# Patient Record
Sex: Female | Born: 1950 | Race: White | Hispanic: No | Smoking: Never smoker
Health system: Southern US, Community
[De-identification: ages and names within clinical notes are randomized; demographics above are authoritative.]

## PROBLEM LIST (undated history)

## (undated) DIAGNOSIS — G4730 Sleep apnea, unspecified: Secondary | ICD-10-CM

## (undated) DIAGNOSIS — M65332 Trigger finger, left middle finger: Secondary | ICD-10-CM

## (undated) DIAGNOSIS — M1990 Unspecified osteoarthritis, unspecified site: Secondary | ICD-10-CM

## (undated) DIAGNOSIS — E119 Type 2 diabetes mellitus without complications: Secondary | ICD-10-CM

## (undated) DIAGNOSIS — G5602 Carpal tunnel syndrome, left upper limb: Secondary | ICD-10-CM

## (undated) DIAGNOSIS — F329 Major depressive disorder, single episode, unspecified: Secondary | ICD-10-CM

## (undated) DIAGNOSIS — G5622 Lesion of ulnar nerve, left upper limb: Secondary | ICD-10-CM

## (undated) DIAGNOSIS — F32A Depression, unspecified: Secondary | ICD-10-CM

## (undated) HISTORY — PX: CLAVICLE SURGERY: SHX598

## (undated) HISTORY — PX: BREAST SURGERY: SHX581

## (undated) HISTORY — PX: ABDOMINAL HYSTERECTOMY: SHX81

## (undated) HISTORY — PX: JOINT REPLACEMENT: SHX530

---

## 2014-03-02 DIAGNOSIS — G4733 Obstructive sleep apnea (adult) (pediatric): Secondary | ICD-10-CM

## 2017-07-10 ENCOUNTER — Ambulatory Visit (HOSPITAL_COMMUNITY)
Admission: EM | Admit: 2017-07-10 | Discharge: 2017-07-10 | Disposition: A | Discharge status: Home / Self Care | Payer: Medicare HMO | Attending: Family Medicine | Admitting: Family Medicine

## 2017-07-10 ENCOUNTER — Encounter (HOSPITAL_COMMUNITY): Payer: Self-pay | Admitting: Emergency Medicine

## 2017-07-10 DIAGNOSIS — S80261A Insect bite (nonvenomous), right knee, initial encounter: Secondary | ICD-10-CM

## 2017-07-10 DIAGNOSIS — W57XXXA Bitten or stung by nonvenomous insect and other nonvenomous arthropods, initial encounter: Secondary | ICD-10-CM | POA: Diagnosis not present

## 2017-07-10 HISTORY — DX: Type 2 diabetes mellitus without complications: E11.9

## 2017-07-10 HISTORY — DX: Depression, unspecified: F32.A

## 2017-07-10 HISTORY — DX: Major depressive disorder, single episode, unspecified: F32.9

## 2017-07-10 MED ORDER — DOXYCYCLINE HYCLATE 100 MG PO TABS: 100.0000 mg | ORAL_TABLET | Freq: Two times a day (BID) | ORAL | 0 refills | Status: DC

## 2017-07-10 NOTE — ED Provider Notes (Signed)
[facility]ARE [facility]ENTER   [id number] 07/10/17 Arrival Time: 1819   SUBJECTIVE:  Sabrina Arroyo is a 67 y.o. female who presents to the urgent care with complaint of tick bite to back of right knee.  Patient developed itching behind her right knee at 1 o'clock this morning. She found a tick there and removed it but found to read spots which are very pruritic.  Past Medical History:  Diagnosis Date  . Depression   . Diabetes mellitus without complication (HCC)    History reviewed. No pertinent family history. Social History   Socioeconomic History  . Marital status: Divorced    Spouse name: Not on file  . Number of children: Not on file  . Years of education: Not on file  . Highest education level: Not on file  Occupational History  . Not on file  Social Needs  . Financial resource strain: Not on file  . Food insecurity:    Worry: Not on file    Inability: Not on file  . Transportation needs:    Medical: Not on file    Non-medical: Not on file  Tobacco Use  . Smoking status: Never Smoker  . Smokeless tobacco: Never Used  Substance and Sexual Activity  . Alcohol use: Not Currently  . Drug use: Never  . Sexual activity: Not on file  Lifestyle  . Physical activity:    Days per week: Not on file    Minutes per session: Not on file  . Stress: Not on file  Relationships  . Social connections:    Talks on phone: Not on file    Gets together: Not on file    Attends religious service: Not on file    Active member of club or organization: Not on file    Attends meetings of clubs or organizations: Not on file    Relationship status: Not on file  . Intimate partner violence:    Fear of current or ex partner: Not on file    Emotionally abused: Not on file    Physically abused: Not on file    Forced sexual activity: Not on file  Other Topics Concern  . Not on file  Social History Narrative  . Not on file   No outpatient medications have been marked as taking for  the 07/10/17 encounter [facility] Encounter).   Allergies  Allergen Reactions  . Codeine       ROS: As per HPI, remainder of ROS negative.   OBJECTIVE:   Vitals:   07/10/17 1859  BP: 129/79  Pulse: 84  Resp: 18  Temp: 98.7 F (37.1 C)  TempSrc: Oral  SpO2: 96%     General appearance: alert; no distress Eyes: PERRL; EOMI; conjunctiva normal HENT: normocephalic; atraumatic;  oral mucosa normal Neck: supple Back: no CVA tenderness Extremities: no cyanosis or edema; symmetrical with no gross deformities Skin: warm and dry; 2 red areas behind right knee in the popliteal area which are mildly inflamed but dry.  3 mm tick brought in plastic bag Neurologic: normal gait; grossly normal Psychological: alert and cooperative; normal mood and affect      Labs:  No results found for this or any previous visit.  Labs Reviewed - No data to display  No results found.     ASSESSMENT & PLAN:  1. Tick bite, initial encounter     Meds ordered this encounter  Medications  . doxycycline (VIBRA-TABS) 100 MG tablet    Sig: Take 1 tablet (100 mg  total) by mouth 2 (two) times daily.    Dispense:  20 tablet    Refill:  0    Reviewed expectations re: course of current medical issues. Questions answered. Outlined signs and symptoms indicating need for more acute intervention. Patient verbalized understanding.     Procedures:      Elvina Sidle, MD 07/10/17 4098

## 2017-07-10 NOTE — ED Triage Notes (Signed)
Pt sts tick bite to back of right knee

## 2017-09-24 ENCOUNTER — Ambulatory Visit
Admission: RE | Admit: 2017-09-24 | Discharge: 2017-09-24 | Disposition: A | Discharge status: Home / Self Care | Payer: Medicare HMO | Source: Ambulatory Visit | Attending: Physician Assistant | Admitting: Physician Assistant

## 2017-09-24 ENCOUNTER — Other Ambulatory Visit (INDEPENDENT_AMBULATORY_CARE_PROVIDER_SITE_OTHER): Payer: Self-pay | Admitting: Physician Assistant

## 2017-09-24 DIAGNOSIS — R52 Pain, unspecified: Secondary | ICD-10-CM

## 2018-10-26 ENCOUNTER — Other Ambulatory Visit: Payer: Self-pay | Admitting: Family Medicine

## 2018-10-26 DIAGNOSIS — M545 Low back pain, unspecified: Secondary | ICD-10-CM

## 2018-11-05 ENCOUNTER — Other Ambulatory Visit: Payer: Self-pay

## 2018-11-05 ENCOUNTER — Ambulatory Visit
Admission: RE | Admit: 2018-11-05 | Discharge: 2018-11-05 | Disposition: A | Discharge status: Home / Self Care | Payer: Medicare HMO | Source: Ambulatory Visit | Attending: Family Medicine | Admitting: Family Medicine

## 2018-11-05 DIAGNOSIS — M545 Low back pain, unspecified: Secondary | ICD-10-CM

## 2019-08-01 ENCOUNTER — Ambulatory Visit: Payer: Medicare Other

## 2020-12-10 ENCOUNTER — Other Ambulatory Visit: Payer: Self-pay | Admitting: Orthopedic Surgery

## 2020-12-19 ENCOUNTER — Other Ambulatory Visit
Admission: RE | Admit: 2020-12-19 | Discharge: 2020-12-19 | Disposition: A | Discharge status: Home / Self Care | Payer: Medicare Other | Source: Ambulatory Visit | Attending: Orthopedic Surgery | Admitting: Orthopedic Surgery

## 2020-12-19 ENCOUNTER — Other Ambulatory Visit: Payer: Self-pay

## 2020-12-19 DIAGNOSIS — Z01818 Encounter for other preprocedural examination: Secondary | ICD-10-CM | POA: Diagnosis not present

## 2020-12-19 HISTORY — DX: Sleep apnea, unspecified: G47.30

## 2020-12-19 HISTORY — DX: Unspecified osteoarthritis, unspecified site: M19.90

## 2020-12-19 LAB — CBC WITH DIFFERENTIAL/PLATELET
Abs Immature Granulocytes: 0.03 10*3/uL (ref 0.00–0.07)
Basophils Absolute: 0 10*3/uL (ref 0.0–0.1)
Basophils Relative: 0 %
Eosinophils Absolute: 0.2 10*3/uL (ref 0.0–0.5)
Eosinophils Relative: 2 %
HCT: 39.7 % (ref 36.0–46.0)
Hemoglobin: 13.6 g/dL (ref 12.0–15.0)
Immature Granulocytes: 1 %
Lymphocytes Relative: 24 %
Lymphs Abs: 1.5 10*3/uL (ref 0.7–4.0)
MCH: 28.9 pg (ref 26.0–34.0)
MCHC: 34.3 g/dL (ref 30.0–36.0)
MCV: 84.5 fL (ref 80.0–100.0)
Monocytes Absolute: 0.4 10*3/uL (ref 0.1–1.0)
Monocytes Relative: 6 %
Neutro Abs: 4.3 10*3/uL (ref 1.7–7.7)
Neutrophils Relative %: 67 %
Platelets: 195 10*3/uL (ref 150–400)
RBC: 4.7 MIL/uL (ref 3.87–5.11)
RDW: 13.2 % (ref 11.5–15.5)
WBC: 6.4 10*3/uL (ref 4.0–10.5)
nRBC: 0 % (ref 0.0–0.2)

## 2020-12-19 LAB — TYPE AND SCREEN
ABO/RH(D): A POS
Antibody Screen: NEGATIVE

## 2020-12-19 LAB — URINALYSIS, ROUTINE W REFLEX MICROSCOPIC
Bilirubin Urine: NEGATIVE
Glucose, UA: NEGATIVE mg/dL
Hgb urine dipstick: NEGATIVE
Ketones, ur: NEGATIVE mg/dL
Leukocytes,Ua: NEGATIVE
Nitrite: NEGATIVE
Protein, ur: NEGATIVE mg/dL
Specific Gravity, Urine: 1.012 (ref 1.005–1.030)
pH: 5 (ref 5.0–8.0)

## 2020-12-19 LAB — COMPREHENSIVE METABOLIC PANEL
ALT: 33 U/L (ref 0–44)
AST: 29 U/L (ref 15–41)
Albumin: 4 g/dL (ref 3.5–5.0)
Alkaline Phosphatase: 60 U/L (ref 38–126)
Anion gap: 7 (ref 5–15)
BUN: 22 mg/dL (ref 8–23)
CO2: 29 mmol/L (ref 22–32)
Calcium: 9.7 mg/dL (ref 8.9–10.3)
Chloride: 100 mmol/L (ref 98–111)
Creatinine, Ser: 0.81 mg/dL (ref 0.44–1.00)
GFR, Estimated: >60 mL/min (ref >60)
Glucose, Bld: 146 mg/dL — ABNORMAL HIGH (ref 70–99)
Potassium: 4.2 mmol/L (ref 3.5–5.1)
Sodium: 136 mmol/L (ref 135–145)
Total Bilirubin: 0.9 mg/dL (ref 0.3–1.2)
Total Protein: 7.6 g/dL (ref 6.5–8.1)

## 2020-12-19 LAB — SURGICAL PCR SCREEN
MRSA, PCR: NEGATIVE
Staphylococcus aureus: NEGATIVE

## 2020-12-19 NOTE — Patient Instructions (Addendum)
Your procedure is scheduled on: Tuesday 12/31/20 Report to the Registration Desk on the 1st floor of the [facility]. To find out your arrival time, please call [phone] between 1PM - 3PM on: Monday 12/30/20  REMEMBER: Instructions that are not followed completely may result in serious medical risk, up to and including death; or upon the discretion of your surgeon and anesthesiologist your surgery may need to be rescheduled.  Do not eat food after midnight the night before surgery.  No gum chewing, lozengers or hard candies.  You may however, drink CLEAR water up to 2 hours before you are scheduled to arrive for your surgery. Do not drink anything within 2 hours of your scheduled arrival time.  Type 1 and Type 2 diabetics should only drink water.  TAKE THESE MEDICATIONS THE MORNING OF SURGERY WITH A SIP OF WATER: omeprazole (PRILOSEC) 20 MG capsule (take one the night before and one on the morning of surgery - helps to prevent nausea after surgery.)  You will need to take your Metformin Saturday 12/28/20 and no more until after surgery.   Follow recommendations from Dr. Rosita Kea or PCP regarding stopping Aspirin  One week prior to surgery: Stop Anti-inflammatories (NSAIDS) such as Advil, Aleve, Ibuprofen, Motrin, Naproxen, Naprosyn and Aspirin based products such as Excedrin, Goodys Powder, BC Powder. Stop ANY OVER THE COUNTER supplements until after surgery such as Biotin 17510 MCG TABS, Cholecalciferol 25 MCG (1000 UT) capsule, Multiple Vitamins-Minerals (MULTIVITAMIN WITH MINERALS) tablet, vitamin C (ASCORBIC ACID) 500 MG tablet.  You may however, continue to take Tylenol if needed for pain up until the day of surgery.  No Alcohol for 24 hours before or after surgery.  No Smoking including e-cigarettes for 24 hours prior to surgery.  No chewable tobacco products for at least 6 hours prior to surgery.  No nicotine patches on the day of surgery.  Do not use any "recreational"  drugs for at least a week prior to your surgery.  Please be advised that the combination of cocaine and anesthesia may have negative outcomes, up to and including death. If you test positive for cocaine, your surgery will be cancelled.  On the morning of surgery brush your teeth with toothpaste and water, you may rinse your mouth with mouthwash if you wish. Do not swallow any toothpaste or mouthwash.  Use CHG Soap or wipes as directed on instruction sheet.  Do not wear jewelry, make-up, hairpins, clips or nail polish.  Do not wear lotions, powders, or perfumes.   Do not shave body from the neck down 48 hours prior to surgery just in case you cut yourself which could leave a site for infection.  Also, freshly shaved skin may become irritated if using the CHG soap.  Hearing aids and may not be worn into surgery.  Do not bring valuables to the hospital. [facility] is not responsible for any missing/lost belongings or valuables.   Notify your doctor if there is any change in your medical condition (cold, fever, infection).  Wear comfortable clothing (specific to your surgery type) to the hospital.  After surgery, you can help prevent lung complications by doing breathing exercises.  Take deep breaths and cough every 1-2 hours. Your doctor may order a device called an Incentive Spirometer to help you take deep breaths.  If you are being admitted to the hospital overnight, leave your suitcase in the car. After surgery it may be brought to your room.  If you are taking public transportation, you  will need to have a responsible adult (18 years or older) with you. Please confirm with your physician that it is acceptable to use public transportation.   Please call the Pre-admissions Testing Dept. at [phone] if you have any questions about these instructions.  Surgery Visitation Policy:  Patients undergoing a surgery or procedure may have one family member or support person with  them as long as that person is not COVID-19 positive or experiencing its symptoms.  That person may remain in the waiting area during the procedure and may rotate out with other people.  Inpatient Visitation:    Visiting hours are 7 a.m. to 8 p.m. Up to two visitors ages 16+ are allowed at one time in a patient room. The visitors may rotate out with other people during the day. Visitors must check out when they leave, or other visitors will not be allowed. One designated support person may remain overnight. The visitor must pass COVID-19 screenings, use hand sanitizer when entering and exiting the patient's room and wear a mask at all times, including in the patient's room. Patients must also wear a mask when staff or their visitor are in the room. Masking is required regardless of vaccination status.

## 2020-12-27 ENCOUNTER — Other Ambulatory Visit: Admission: RE | Admit: 2020-12-27 | Payer: Medicare Other | Source: Ambulatory Visit

## 2020-12-31 ENCOUNTER — Observation Stay: Payer: Medicare Other

## 2020-12-31 ENCOUNTER — Other Ambulatory Visit: Payer: Self-pay

## 2020-12-31 ENCOUNTER — Ambulatory Visit: Payer: Medicare Other | Admitting: Certified Registered Nurse Anesthetist

## 2020-12-31 ENCOUNTER — Encounter
Admission: RE | Disposition: A | Discharge status: Home / Self Care | Payer: Self-pay | Source: Ambulatory Visit | Attending: Orthopedic Surgery

## 2020-12-31 ENCOUNTER — Observation Stay
Admission: RE | Admit: 2020-12-31 | Discharge: 2021-01-02 | Disposition: A | Discharge status: Home / Self Care | Payer: Medicare Other | Source: Ambulatory Visit | Attending: Orthopedic Surgery | Admitting: Orthopedic Surgery

## 2020-12-31 ENCOUNTER — Encounter: Payer: Self-pay | Admitting: Orthopedic Surgery

## 2020-12-31 DIAGNOSIS — Z96651 Presence of right artificial knee joint: Secondary | ICD-10-CM | POA: Diagnosis not present

## 2020-12-31 DIAGNOSIS — Z79899 Other long term (current) drug therapy: Secondary | ICD-10-CM | POA: Diagnosis not present

## 2020-12-31 DIAGNOSIS — Z7984 Long term (current) use of oral hypoglycemic drugs: Secondary | ICD-10-CM | POA: Insufficient documentation

## 2020-12-31 DIAGNOSIS — I10 Essential (primary) hypertension: Secondary | ICD-10-CM | POA: Diagnosis not present

## 2020-12-31 DIAGNOSIS — Z96652 Presence of left artificial knee joint: Secondary | ICD-10-CM

## 2020-12-31 DIAGNOSIS — Z8616 Personal history of COVID-19: Secondary | ICD-10-CM | POA: Diagnosis not present

## 2020-12-31 DIAGNOSIS — M1712 Unilateral primary osteoarthritis, left knee: Secondary | ICD-10-CM | POA: Diagnosis present

## 2020-12-31 DIAGNOSIS — G8918 Other acute postprocedural pain: Secondary | ICD-10-CM

## 2020-12-31 HISTORY — PX: TOTAL KNEE ARTHROPLASTY: SHX125

## 2020-12-31 LAB — ABO/RH: ABO/RH(D): A POS

## 2020-12-31 LAB — CBC
HCT: 38.1 % (ref 36.0–46.0)
Hemoglobin: 12.3 g/dL (ref 12.0–15.0)
MCH: 27.6 pg (ref 26.0–34.0)
MCHC: 32.3 g/dL (ref 30.0–36.0)
MCV: 85.6 fL (ref 80.0–100.0)
Platelets: 168 10*3/uL (ref 150–400)
RBC: 4.45 MIL/uL (ref 3.87–5.11)
RDW: 13.5 % (ref 11.5–15.5)
WBC: 9.2 10*3/uL (ref 4.0–10.5)
nRBC: 0 % (ref 0.0–0.2)

## 2020-12-31 LAB — CREATININE, SERUM
Creatinine, Ser: 0.93 mg/dL (ref 0.44–1.00)
GFR, Estimated: >60 mL/min (ref >60)

## 2020-12-31 LAB — GLUCOSE, CAPILLARY
Glucose-Capillary: 149 mg/dL — ABNORMAL HIGH (ref 70–99)
Glucose-Capillary: 160 mg/dL — ABNORMAL HIGH (ref 70–99)
Glucose-Capillary: 165 mg/dL — ABNORMAL HIGH (ref 70–99)
Glucose-Capillary: 127 mg/dL — ABNORMAL HIGH (ref 70–99)

## 2020-12-31 SURGERY — ARTHROPLASTY, KNEE, TOTAL
Anesthesia: Spinal | Site: Knee | Laterality: Left

## 2020-12-31 MED ORDER — BISACODYL 10 MG RE SUPP
10.0000 mg | Freq: Every day | RECTAL | Status: DC | PRN
  Filled 2020-12-31: qty 1

## 2020-12-31 MED ORDER — HYDROCODONE-ACETAMINOPHEN 5-325 MG PO TABS
1.0000 | ORAL_TABLET | ORAL | Status: DC | PRN
  Administered 2021-01-01: 1 via ORAL
  Filled 2020-12-31: qty 1

## 2020-12-31 MED ORDER — DOCUSATE SODIUM 100 MG PO CAPS
100.0000 mg | ORAL_CAPSULE | Freq: Two times a day (BID) | ORAL | Status: DC
  Administered 2020-12-31 – 2021-01-02 (×4): 100 mg via ORAL
  Filled 2020-12-31 (×4): qty 1

## 2020-12-31 MED ORDER — ALUM & MAG HYDROXIDE-SIMETH 200-200-20 MG/5ML PO SUSP: 30.0000 mL | ORAL | Status: DC | PRN

## 2020-12-31 MED ORDER — OXYCODONE HCL 5 MG PO TABS
5.0000 mg | ORAL_TABLET | Freq: Once | ORAL | Status: DC | PRN
Start: 2020-12-31 — End: 2020-12-31

## 2020-12-31 MED ORDER — PROPOFOL 500 MG/50ML IV EMUL
INTRAVENOUS | Status: DC | PRN
  Administered 2020-12-31: 100 ug/kg/min via INTRAVENOUS

## 2020-12-31 MED ORDER — GLYCOPYRROLATE 0.2 MG/ML IJ SOLN
INTRAMUSCULAR | Status: DC | PRN
  Administered 2020-12-31: .1 mg via INTRAVENOUS
  Administered 2020-12-31: .2 mg via INTRAVENOUS

## 2020-12-31 MED ORDER — CHLORHEXIDINE GLUCONATE 0.12 % MT SOLN: 15.0000 mL | Freq: Once | OROMUCOSAL | Status: AC

## 2020-12-31 MED ORDER — MORPHINE SULFATE (PF) 2 MG/ML IV SOLN
0.5000 mg | INTRAVENOUS | Status: DC | PRN
  Administered 2020-12-31: 1 mg via INTRAVENOUS
  Filled 2020-12-31: qty 1

## 2020-12-31 MED ORDER — ORAL CARE MOUTH RINSE: 15.0000 mL | Freq: Once | OROMUCOSAL | Status: AC

## 2020-12-31 MED ORDER — BUPIVACAINE HCL (PF) 0.5 % IJ SOLN
INTRAMUSCULAR | Status: AC
  Filled 2020-12-31: qty 10

## 2020-12-31 MED ORDER — BUPIVACAINE-EPINEPHRINE (PF) 0.25% -1:200000 IJ SOLN
INTRAMUSCULAR | Status: AC
  Filled 2020-12-31: qty 30

## 2020-12-31 MED ORDER — ENOXAPARIN SODIUM 30 MG/0.3ML IJ SOSY
30.0000 mg | PREFILLED_SYRINGE | Freq: Two times a day (BID) | INTRAMUSCULAR | Status: DC
  Administered 2021-01-01 – 2021-01-02 (×3): 30 mg via SUBCUTANEOUS
  Filled 2020-12-31 (×3): qty 0.3

## 2020-12-31 MED ORDER — SODIUM CHLORIDE 0.9 % IV SOLN: INTRAVENOUS | Status: DC

## 2020-12-31 MED ORDER — CEFAZOLIN SODIUM-DEXTROSE 2-4 GM/100ML-% IV SOLN
INTRAVENOUS | Status: AC
  Filled 2020-12-31: qty 100

## 2020-12-31 MED ORDER — NEOMYCIN-POLYMYXIN B GU 40-200000 IR SOLN
Status: AC
  Filled 2020-12-31: qty 1

## 2020-12-31 MED ORDER — CEFAZOLIN SODIUM-DEXTROSE 2-4 GM/100ML-% IV SOLN
2.0000 g | Freq: Four times a day (QID) | INTRAVENOUS | Status: AC
  Administered 2020-12-31 (×2): 2 g via INTRAVENOUS
  Filled 2020-12-31 (×2): qty 100

## 2020-12-31 MED ORDER — PRAMIPEXOLE DIHYDROCHLORIDE 0.25 MG PO TABS
0.5000 mg | ORAL_TABLET | Freq: Every day | ORAL | Status: DC
  Administered 2020-12-31 – 2021-01-01 (×2): 0.5 mg via ORAL
  Filled 2020-12-31 (×3): qty 2

## 2020-12-31 MED ORDER — TRANEXAMIC ACID 1000 MG/10ML IV SOLN
INTRAVENOUS | Status: AC
  Filled 2020-12-31: qty 10

## 2020-12-31 MED ORDER — HYDROCODONE-ACETAMINOPHEN 7.5-325 MG PO TABS
ORAL_TABLET | ORAL | Status: AC
  Administered 2020-12-31: 1 via ORAL
  Filled 2020-12-31: qty 1

## 2020-12-31 MED ORDER — FLEET ENEMA 7-19 GM/118ML RE ENEM: 1.0000 | ENEMA | Freq: Once | RECTAL | Status: DC | PRN

## 2020-12-31 MED ORDER — ADULT MULTIVITAMIN W/MINERALS CH
1.0000 | ORAL_TABLET | Freq: Every day | ORAL | Status: DC
  Administered 2021-01-01 – 2021-01-02 (×2): 1 via ORAL
  Filled 2020-12-31 (×2): qty 1

## 2020-12-31 MED ORDER — ACETAMINOPHEN 10 MG/ML IV SOLN: 1000.0000 mg | Freq: Once | INTRAVENOUS | Status: DC | PRN

## 2020-12-31 MED ORDER — PHENYLEPHRINE HCL (PRESSORS) 10 MG/ML IV SOLN
INTRAVENOUS | Status: AC
  Filled 2020-12-31: qty 1

## 2020-12-31 MED ORDER — DIPHENHYDRAMINE HCL 12.5 MG/5ML PO ELIX
12.5000 mg | ORAL_SOLUTION | ORAL | Status: DC | PRN
  Filled 2020-12-31: qty 10

## 2020-12-31 MED ORDER — SODIUM CHLORIDE (PF) 0.9 % IJ SOLN
INTRAMUSCULAR | Status: DC | PRN
  Administered 2020-12-31: 91 mL

## 2020-12-31 MED ORDER — EPHEDRINE 5 MG/ML INJ
INTRAVENOUS | Status: AC
  Filled 2020-12-31: qty 5

## 2020-12-31 MED ORDER — PHENYLEPHRINE HCL-NACL 20-0.9 MG/250ML-% IV SOLN
INTRAVENOUS | Status: DC | PRN
  Administered 2020-12-31: 30 ug/min via INTRAVENOUS

## 2020-12-31 MED ORDER — OXYCODONE HCL 5 MG/5ML PO SOLN: 5.0000 mg | Freq: Once | ORAL | Status: DC | PRN

## 2020-12-31 MED ORDER — ONDANSETRON HCL 4 MG/2ML IJ SOLN: 4.0000 mg | Freq: Four times a day (QID) | INTRAMUSCULAR | Status: DC | PRN

## 2020-12-31 MED ORDER — ONDANSETRON HCL 4 MG PO TABS
4.0000 mg | ORAL_TABLET | Freq: Four times a day (QID) | ORAL | Status: DC | PRN
  Administered 2020-12-31: 4 mg via ORAL
  Filled 2020-12-31: qty 1

## 2020-12-31 MED ORDER — POLYETHYLENE GLYCOL 3350 17 G PO PACK
17.0000 g | PACK | Freq: Every day | ORAL | Status: DC | PRN
  Filled 2020-12-31: qty 1

## 2020-12-31 MED ORDER — HYDROCODONE-ACETAMINOPHEN 7.5-325 MG PO TABS
1.0000 | ORAL_TABLET | ORAL | Status: DC | PRN
  Administered 2020-12-31: 1 via ORAL
  Filled 2020-12-31: qty 1

## 2020-12-31 MED ORDER — PHENOL 1.4 % MT LIQD
1.0000 | OROMUCOSAL | Status: DC | PRN
  Filled 2020-12-31: qty 177

## 2020-12-31 MED ORDER — MIDAZOLAM HCL 5 MG/5ML IJ SOLN
INTRAMUSCULAR | Status: DC | PRN
  Administered 2020-12-31: 2 mg via INTRAVENOUS

## 2020-12-31 MED ORDER — ONDANSETRON HCL 4 MG/2ML IJ SOLN
INTRAMUSCULAR | Status: AC
  Filled 2020-12-31: qty 2

## 2020-12-31 MED ORDER — METOCLOPRAMIDE HCL 10 MG PO TABS: 5.0000 mg | ORAL_TABLET | Freq: Three times a day (TID) | ORAL | Status: DC | PRN

## 2020-12-31 MED ORDER — TRAMADOL HCL 50 MG PO TABS
50.0000 mg | ORAL_TABLET | Freq: Four times a day (QID) | ORAL | Status: DC
  Administered 2020-12-31 – 2021-01-02 (×8): 50 mg via ORAL
  Filled 2020-12-31 (×9): qty 1

## 2020-12-31 MED ORDER — MENTHOL 3 MG MT LOZG
1.0000 | LOZENGE | OROMUCOSAL | Status: DC | PRN
  Filled 2020-12-31: qty 9

## 2020-12-31 MED ORDER — METFORMIN HCL ER 500 MG PO TB24
1000.0000 mg | ORAL_TABLET | Freq: Two times a day (BID) | ORAL | Status: DC
  Administered 2020-12-31 – 2021-01-02 (×4): 1000 mg via ORAL
  Filled 2020-12-31 (×7): qty 2

## 2020-12-31 MED ORDER — CEFAZOLIN SODIUM-DEXTROSE 2-4 GM/100ML-% IV SOLN
2.0000 g | INTRAVENOUS | Status: AC
  Administered 2020-12-31: 2 g via INTRAVENOUS

## 2020-12-31 MED ORDER — ZOLPIDEM TARTRATE 5 MG PO TABS: 5.0000 mg | ORAL_TABLET | Freq: Every evening | ORAL | Status: DC | PRN

## 2020-12-31 MED ORDER — PROMETHAZINE HCL 25 MG/ML IJ SOLN: 6.2500 mg | INTRAMUSCULAR | Status: DC | PRN

## 2020-12-31 MED ORDER — MORPHINE SULFATE (PF) 10 MG/ML IV SOLN
INTRAVENOUS | Status: AC
  Filled 2020-12-31: qty 1

## 2020-12-31 MED ORDER — CHLORHEXIDINE GLUCONATE 0.12 % MT SOLN
OROMUCOSAL | Status: AC
  Administered 2020-12-31: 15 mL via OROMUCOSAL
  Filled 2020-12-31: qty 15

## 2020-12-31 MED ORDER — AMPHETAMINE-DEXTROAMPHET ER 5 MG PO CP24
20.0000 mg | ORAL_CAPSULE | Freq: Every day | ORAL | Status: DC
  Administered 2021-01-01 – 2021-01-02 (×2): 20 mg via ORAL
  Filled 2020-12-31 (×3): qty 4
  Filled 2020-12-31: qty 1

## 2020-12-31 MED ORDER — BUPIVACAINE HCL (PF) 0.5 % IJ SOLN
INTRAMUSCULAR | Status: DC | PRN
  Administered 2020-12-31: 2.5 mL

## 2020-12-31 MED ORDER — ACETAMINOPHEN 325 MG PO TABS
325.0000 mg | ORAL_TABLET | Freq: Four times a day (QID) | ORAL | Status: DC | PRN
  Administered 2021-01-01: 650 mg via ORAL
  Filled 2020-12-31: qty 2

## 2020-12-31 MED ORDER — GLYCOPYRROLATE 0.2 MG/ML IJ SOLN
INTRAMUSCULAR | Status: AC
  Filled 2020-12-31: qty 2

## 2020-12-31 MED ORDER — FENTANYL CITRATE (PF) 100 MCG/2ML IJ SOLN
INTRAMUSCULAR | Status: AC
  Filled 2020-12-31: qty 2

## 2020-12-31 MED ORDER — SODIUM CHLORIDE FLUSH 0.9 % IV SOLN
INTRAVENOUS | Status: AC
  Filled 2020-12-31: qty 40

## 2020-12-31 MED ORDER — PROPOFOL 1000 MG/100ML IV EMUL
INTRAVENOUS | Status: AC
  Filled 2020-12-31: qty 100

## 2020-12-31 MED ORDER — MIDAZOLAM HCL 2 MG/2ML IJ SOLN
INTRAMUSCULAR | Status: AC
  Filled 2020-12-31: qty 2

## 2020-12-31 MED ORDER — ONDANSETRON HCL 4 MG/2ML IJ SOLN
INTRAMUSCULAR | Status: DC | PRN
  Administered 2020-12-31: 4 mg via INTRAVENOUS

## 2020-12-31 MED ORDER — ARIPIPRAZOLE 5 MG PO TABS
5.0000 mg | ORAL_TABLET | Freq: Every day | ORAL | Status: DC
  Administered 2020-12-31 – 2021-01-01 (×2): 5 mg via ORAL
  Filled 2020-12-31 (×3): qty 1

## 2020-12-31 MED ORDER — ASPIRIN EC 81 MG PO TBEC
81.0000 mg | DELAYED_RELEASE_TABLET | Freq: Every day | ORAL | Status: DC
  Administered 2021-01-01 – 2021-01-02 (×2): 81 mg via ORAL
  Filled 2020-12-31 (×2): qty 1

## 2020-12-31 MED ORDER — VORTIOXETINE HBR 5 MG PO TABS
20.0000 mg | ORAL_TABLET | Freq: Every day | ORAL | Status: DC
  Administered 2020-12-31 – 2021-01-01 (×2): 20 mg via ORAL
  Filled 2020-12-31 (×3): qty 4

## 2020-12-31 MED ORDER — SODIUM CHLORIDE 0.9 % IR SOLN: Status: DC | PRN

## 2020-12-31 MED ORDER — PANTOPRAZOLE SODIUM 40 MG PO TBEC
40.0000 mg | DELAYED_RELEASE_TABLET | Freq: Every day | ORAL | Status: DC
  Administered 2021-01-01 – 2021-01-02 (×2): 40 mg via ORAL
  Filled 2020-12-31 (×2): qty 1

## 2020-12-31 MED ORDER — STERILE WATER FOR IRRIGATION IR SOLN
Status: DC | PRN
  Administered 2020-12-31: 1000 mL

## 2020-12-31 MED ORDER — METOCLOPRAMIDE HCL 5 MG/ML IJ SOLN: 5.0000 mg | Freq: Three times a day (TID) | INTRAMUSCULAR | Status: DC | PRN

## 2020-12-31 MED ORDER — METHOCARBAMOL 500 MG PO TABS
500.0000 mg | ORAL_TABLET | Freq: Four times a day (QID) | ORAL | Status: DC | PRN
  Administered 2020-12-31 – 2021-01-02 (×2): 500 mg via ORAL
  Filled 2020-12-31 (×2): qty 1

## 2020-12-31 MED ORDER — BUPIVACAINE LIPOSOME 1.3 % IJ SUSP
INTRAMUSCULAR | Status: AC
  Filled 2020-12-31: qty 20

## 2020-12-31 MED ORDER — ROSUVASTATIN CALCIUM 20 MG PO TABS
40.0000 mg | ORAL_TABLET | Freq: Every day | ORAL | Status: DC
  Administered 2020-12-31 – 2021-01-01 (×2): 40 mg via ORAL
  Filled 2020-12-31: qty 4
  Filled 2020-12-31 (×2): qty 2
  Filled 2020-12-31: qty 4
  Filled 2020-12-31: qty 2

## 2020-12-31 MED ORDER — TRANEXAMIC ACID-NACL 1000-0.7 MG/100ML-% IV SOLN
INTRAVENOUS | Status: DC | PRN
  Administered 2020-12-31: 1000 mg via INTRAVENOUS

## 2020-12-31 MED ORDER — INSULIN ASPART 100 UNIT/ML IJ SOLN
0.0000 [IU] | Freq: Three times a day (TID) | INTRAMUSCULAR | Status: DC
  Administered 2021-01-02 (×2): 3 [IU] via SUBCUTANEOUS
  Filled 2020-12-31 (×5): qty 1

## 2020-12-31 MED ORDER — METHOCARBAMOL 1000 MG/10ML IJ SOLN
500.0000 mg | Freq: Four times a day (QID) | INTRAVENOUS | Status: DC | PRN
  Filled 2020-12-31: qty 5

## 2020-12-31 MED ORDER — SODIUM CHLORIDE 0.9 % IV SOLN
INTRAVENOUS | Status: DC
  Administered 2020-12-31: 1000 mL via INTRAVENOUS

## 2020-12-31 SURGICAL SUPPLY — 67 items
BLADE SAGITTAL 25.0X1.19X90 (BLADE) ×2 IMPLANT
BLADE SAW 90X13X1.19 OSCILLAT (BLADE) ×2 IMPLANT
BNDG ELASTIC 6X5.8 VLCR STR LF (GAUZE/BANDAGES/DRESSINGS) ×2 IMPLANT
CANISTER WOUND CARE 500ML ATS (WOUND CARE) ×2 IMPLANT
CEMENT FEMORAL COMP SZ3P LEFT (Femur) ×2 IMPLANT
CEMENT HV SMART SET (Cement) ×4 IMPLANT
CHLORAPREP W/TINT 26 (MISCELLANEOUS) ×4 IMPLANT
COOLER POLAR GLACIER W/PUMP (MISCELLANEOUS) ×2 IMPLANT
CUFF TOURN SGL QUICK 24 (TOURNIQUET CUFF)
CUFF TOURN SGL QUICK 34 (TOURNIQUET CUFF)
CUFF TRNQT CYL 24X4X16.5-23 (TOURNIQUET CUFF) IMPLANT
CUFF TRNQT CYL 34X4.125X (TOURNIQUET CUFF) IMPLANT
DRAPE 3/4 80X56 (DRAPES) ×4 IMPLANT
DRSG MEPILEX SACRM 8.7X9.8 (GAUZE/BANDAGES/DRESSINGS) ×2 IMPLANT
ELECT CAUTERY BLADE 6.4 (BLADE) ×2 IMPLANT
ELECT REM PT RETURN 9FT ADLT (ELECTROSURGICAL) ×2
ELECTRODE REM PT RTRN 9FT ADLT (ELECTROSURGICAL) ×1 IMPLANT
GAUZE 4X4 16PLY ~~LOC~~+RFID DBL (SPONGE) ×2 IMPLANT
GAUZE SPONGE 4X4 12PLY STRL (GAUZE/BANDAGES/DRESSINGS) ×2 IMPLANT
GAUZE XEROFORM 1X8 LF (GAUZE/BANDAGES/DRESSINGS) ×2 IMPLANT
GLOVE SURG ORTHO LTX SZ8 (GLOVE) ×2 IMPLANT
GLOVE SURG SYN 9.0  PF PI (GLOVE) ×1
GLOVE SURG SYN 9.0 PF PI (GLOVE) ×1 IMPLANT
GLOVE SURG UNDER LTX SZ8 (GLOVE) ×2 IMPLANT
GLOVE SURG UNDER POLY LF SZ9 (GLOVE) ×2 IMPLANT
GOWN SRG 2XL LVL 4 RGLN SLV (GOWNS) ×1 IMPLANT
GOWN STRL NON-REIN 2XL LVL4 (GOWNS) ×1
GOWN STRL REUS W/ TWL LRG LVL3 (GOWN DISPOSABLE) ×1 IMPLANT
GOWN STRL REUS W/ TWL XL LVL3 (GOWN DISPOSABLE) ×1 IMPLANT
GOWN STRL REUS W/TWL LRG LVL3 (GOWN DISPOSABLE) ×1
GOWN STRL REUS W/TWL XL LVL3 (GOWN DISPOSABLE) ×1
HOLDER FOLEY CATH W/STRAP (MISCELLANEOUS) ×2 IMPLANT
INSERT TIBIAL SZ3 L HT 10M FLX (Insert) ×2 IMPLANT
IRRIGATION SURGIPHOR STRL (IV SOLUTION) IMPLANT
IV NS IRRIG 3000ML ARTHROMATIC (IV SOLUTION) ×2 IMPLANT
KIT PREVENA INCISION MGT20CM45 (CANNISTER) ×2 IMPLANT
KIT TURNOVER KIT A (KITS) ×2 IMPLANT
MANIFOLD NEPTUNE II (INSTRUMENTS) ×2 IMPLANT
NDL SAFETY ECLIPSE 18X1.5 (NEEDLE) ×1 IMPLANT
NEEDLE HYPO 18GX1.5 SHARP (NEEDLE) ×1
NEEDLE SPNL 18GX3.5 QUINCKE PK (NEEDLE) ×2 IMPLANT
NEEDLE SPNL 20GX3.5 QUINCKE YW (NEEDLE) ×2 IMPLANT
NS IRRIG 1000ML POUR BTL (IV SOLUTION) ×2 IMPLANT
PACK TOTAL KNEE (MISCELLANEOUS) ×2 IMPLANT
PAD WRAPON POLAR KNEE (MISCELLANEOUS) ×1 IMPLANT
PATELLA RESURFACING MEDACTA 02 (Bone Implant) ×2 IMPLANT
PENCIL SMOKE EVACUATOR COATED (MISCELLANEOUS) ×2 IMPLANT
PULSAVAC PLUS IRRIG FAN TIP (DISPOSABLE) ×2
SCALPEL PROTECTED #10 DISP (BLADE) ×4 IMPLANT
SPONGE T-LAP 18X18 ~~LOC~~+RFID (SPONGE) ×6 IMPLANT
STAPLER SKIN PROX 35W (STAPLE) ×2 IMPLANT
STEM EXTENSION 11MMX30MM (Stem) ×2 IMPLANT
SUCTION FRAZIER HANDLE 10FR (MISCELLANEOUS) ×1
SUCTION TUBE FRAZIER 10FR DISP (MISCELLANEOUS) ×1 IMPLANT
SUT DVC 2 QUILL PDO  T11 36X36 (SUTURE) ×1
SUT DVC 2 QUILL PDO T11 36X36 (SUTURE) ×1 IMPLANT
SUT ETHIBOND 2 V 37 (SUTURE) ×2 IMPLANT
SUT V-LOC 90 ABS DVC 3-0 CL (SUTURE) ×2 IMPLANT
SYR 20ML LL LF (SYRINGE) ×2 IMPLANT
SYR 50ML LL SCALE MARK (SYRINGE) ×4 IMPLANT
TIBIAL TRAY FIXED MEDACTA 0207 (Bone Implant) ×2 IMPLANT
TIP FAN IRRIG PULSAVAC PLUS (DISPOSABLE) ×1 IMPLANT
TOWEL OR 17X26 4PK STRL BLUE (TOWEL DISPOSABLE) ×2 IMPLANT
TOWER CARTRIDGE SMART MIX (DISPOSABLE) ×2 IMPLANT
TRAY FOLEY MTR SLVR 16FR STAT (SET/KITS/TRAYS/PACK) ×2 IMPLANT
WATER STERILE IRR 500ML POUR (IV SOLUTION) ×2 IMPLANT
WRAPON POLAR PAD KNEE (MISCELLANEOUS) ×2

## 2020-12-31 NOTE — Transfer of Care (Signed)
Immediate Anesthesia Transfer of Care Note  Patient: Sabrina Arroyo  Procedure(s) Performed: TOTAL KNEE ARTHROPLASTY (Left: Knee)  Patient Location: PACU  Anesthesia Type:Spinal  Level of Consciousness: awake  Airway & Oxygen Therapy: Patient Spontanous Breathing and Patient connected to face mask oxygen  Post-op Assessment: Report given to RN and Post -op Vital signs reviewed and stable  Post vital signs: Reviewed  Last Vitals:  Vitals Value Taken Time  BP 102/61 12/31/20 0945  Temp    Pulse    Resp 12 12/31/20 0946  SpO2    Vitals shown include unvalidated device data.  Last Pain:  Vitals:   12/31/20 0629  TempSrc: Oral  PainSc: 0-No pain         Complications: No notable events documented.

## 2020-12-31 NOTE — Progress Notes (Signed)
OT Cancellation Note  Patient Details Name: Sabrina Arroyo MRN: [id number] DOB: [birth year]   Cancelled Treatment:    Reason Eval/Treat Not Completed: Pain limiting ability to participate (pt in significant pain affecting meaningful participation in further mobility, education re: ADL performance and participation in evaluation, will re-attempt at a later time.)  Oleta Mouse, OTD OTR/L  12/31/20, 3:20 PM

## 2020-12-31 NOTE — Evaluation (Signed)
Physical Therapy Evaluation Patient Details Name: Sabrina Arroyo MRN: [id number] DOB: [birth year] Today's Date: 12/31/2020  History of Present Illness  The patient presents for L TKA.  She had a prior right total knee arthroplasty in 2014.  Clinical Impression  Patient received in bed, reporting pain. Grimacing. She is agreeable to PT assessment. Required min assist for bed mobility and cues for scooting to edge of bed. Min assist for sit to stand at edge of bed. Patient was able to weight shift in standing, but declined ambulation in room at this time. She will continue to benefit from skilled PT while here to improve strength, rom and functional independence for safe return home.        Recommendations for follow up therapy are one component of a multi-disciplinary discharge planning process, led by the attending physician.  Recommendations may be updated based on patient status, additional functional criteria and insurance authorization.  Follow Up Recommendations Home health PT    Assistance Recommended at Discharge Intermittent Supervision/Assistance  Functional Status Assessment Patient has had a recent decline in their functional status and demonstrates the ability to make significant improvements in function in a reasonable and predictable amount of time.  Equipment Recommendations  3in1 (PT);Rolling walker (2 wheels)    Recommendations for Other Services       Precautions / Restrictions Precautions Precautions: Fall Restrictions Weight Bearing Restrictions: Yes LLE Weight Bearing: Weight bearing as tolerated      Mobility  Bed Mobility Overal bed mobility: Needs Assistance Bed Mobility: Supine to Sit;Sit to Supine     Supine to sit: Min assist Sit to supine: Min assist   General bed mobility comments: min assist to move L LE in bed and to raise trunk to seated position.    Transfers Overall transfer level: Needs assistance Equipment used: Rolling walker (2  wheels) Transfers: Sit to/from Stand Sit to Stand: Min guard                Ambulation/Gait             General Gait Details: patient declining ambulation at this time due to pain.  Stairs            Wheelchair Mobility    Modified Rankin (Stroke Patients Only)       Balance Overall balance assessment: Needs assistance Sitting-balance support: Feet supported Sitting balance-Leahy Scale: Good     Standing balance support: Bilateral upper extremity supported;During functional activity Standing balance-Leahy Scale: Fair Standing balance comment: Requiring B UE support and min guard                             Pertinent Vitals/Pain Pain Assessment: 0-10 Pain Score: 4  Pain Location: L knee Pain Descriptors / Indicators: Discomfort;Aching;Grimacing;Guarding;Sore Pain Intervention(s): Monitored during session;Limited activity within patient's tolerance;Repositioned;Premedicated before session;Ice applied    Home Living Family/patient expects to be discharged to:: Private residence Living Arrangements: Alone Available Help at Discharge: Family;Friend(s);Available PRN/intermittently Type of Home: House Home Access: Stairs to enter Entrance Stairs-Rails: Right;Left;Can reach both Entrance Stairs-Number of Steps: 3-4   Home Layout: One level Home Equipment: Toilet riser      Prior Function Prior Level of Function : Independent/Modified Independent                     Hand Dominance        Extremity/Trunk Assessment   Upper Extremity Assessment Upper Extremity Assessment: Overall [facility]  for tasks assessed    Lower Extremity Assessment Lower Extremity Assessment: LLE deficits/detail LLE: Unable to fully assess due to pain LLE Coordination: decreased gross motor    Cervical / Trunk Assessment Cervical / Trunk Assessment: Normal  Communication   Communication: No difficulties  Cognition Arousal/Alertness: Awake/alert Behavior  During Therapy: WFL for tasks assessed/performed Overall Cognitive Status: Within Functional Limits for tasks assessed                                          General Comments      Exercises     Assessment/Plan    PT Assessment Patient needs continued PT services  PT Problem List Decreased strength;Decreased mobility;Decreased range of motion;Decreased balance;Pain;Decreased activity tolerance;Decreased knowledge of use of DME       PT Treatment Interventions DME instruction;Therapeutic activities;Gait training;Therapeutic exercise;Patient/family education;Stair training;Functional mobility training;Balance training    PT Goals (Current goals can be found in the Care Plan section)  Acute Rehab PT Goals Patient Stated Goal: to return home, decrease pain PT Goal Formulation: With patient/family Time For Goal Achievement: 01/04/21 Potential to Achieve Goals: Good    Frequency BID   Barriers to discharge        Co-evaluation               AM-PAC PT "6 Clicks" Mobility  Outcome Measure Help needed turning from your back to your side while in a flat bed without using bedrails?: A Little Help needed moving from lying on your back to sitting on the side of a flat bed without using bedrails?: A Little Help needed moving to and from a bed to a chair (including a wheelchair)?: A Little Help needed standing up from a chair using your arms (e.g., wheelchair or bedside chair)?: A Little Help needed to walk in hospital room?: A Lot Help needed climbing 3-5 steps with a railing? : A Lot 6 Click Score: 16    End of Session Equipment Utilized During Treatment: Gait belt Activity Tolerance: Patient limited by pain Patient left: in bed;with call bell/phone within reach;with bed alarm set;with family/visitor present;with SCD's reapplied Nurse Communication: Mobility status PT Visit Diagnosis: Other abnormalities of gait and mobility (R26.89);Pain;Difficulty in  walking, not elsewhere classified (R26.2) Pain - Right/Left: Left Pain - part of body: Knee    Time: 7412-8786 PT Time Calculation (min) (ACUTE ONLY): 20 min   Charges:   PT Evaluation $PT Eval Moderate Complexity: 1 Mod          Sally Menard, PT, GCS 12/31/20,3:14 PM

## 2020-12-31 NOTE — Anesthesia Procedure Notes (Signed)
Spinal  Patient location during procedure: OR Reason for block: surgical anesthesia Staffing Performed: other anesthesia staff  Anesthesiologist: Iran Ouch, MD Resident/CRNA: Rolla Plate, CRNA Other anesthesia staff: Lucita Ferrara, RN Preanesthetic Checklist Completed: patient identified, IV checked, site marked, risks and benefits discussed, surgical consent, monitors and equipment checked, pre-op evaluation and timeout performed Spinal Block Patient position: sitting Prep: ChloraPrep and site prepped and draped Patient monitoring: heart rate, continuous pulse ox, blood pressure and cardiac monitor Approach: midline Location: L4-5 Injection technique: single-shot Needle Needle type: Introducer and Pencan  Needle gauge: 24 G Needle length: 9 cm Assessment Events: CSF return Additional Notes Negative paresthesia. Negative blood return. Positive free-flowing CSF. Expiration date of kit checked and confirmed. Patient tolerated procedure well, without complications.

## 2020-12-31 NOTE — Anesthesia Preprocedure Evaluation (Addendum)
Anesthesia Evaluation  Patient identified by MRN, date of birth, ID band Patient awake    Reviewed: Allergy & Precautions, NPO status , Patient's Chart, lab work & pertinent test results  Airway Mallampati: III  TM Distance: >3 FB Neck ROM: Full    Dental no notable dental hx.    Pulmonary sleep apnea and Continuous Positive Airway Pressure Ventilation ,    Pulmonary exam normal breath sounds clear to auscultation       Cardiovascular Exercise Tolerance: Poor METS: 3 - Mets Normal cardiovascular exam Rhythm:Regular Rate:Normal     Neuro/Psych PSYCHIATRIC DISORDERS Depression negative neurological ROS     GI/Hepatic Neg liver ROS, GERD  Controlled and Medicated,  Endo/Other  diabetes, Well Controlled, Type 2, Oral Hypoglycemic Agents  Renal/GU negative Renal ROS  negative genitourinary   Musculoskeletal  (+) Arthritis , Osteoarthritis,    Abdominal (+) + obese,   Peds negative pediatric ROS (+)  Hematology negative hematology ROS (+)   Anesthesia Other Findings Obese  Reproductive/Obstetrics negative OB ROS                            Anesthesia Physical Anesthesia Plan  ASA: 3  Anesthesia Plan: Spinal   Post-op Pain Management:    Induction:   PONV Risk Score and Plan: 2 and TIVA and Treatment may vary due to age or medical condition  Airway Management Planned: Natural Airway and Simple Face Mask  Additional Equipment:   Intra-op Plan:   Post-operative Plan:   Informed Consent: I have reviewed the patients History and Physical, chart, labs and discussed the procedure including the risks, benefits and alternatives for the proposed anesthesia with the patient or authorized representative who has indicated his/her understanding and acceptance.     Dental advisory given  Plan Discussed with: CRNA, Anesthesiologist and Surgeon  Anesthesia Plan Comments:         Anesthesia Quick Evaluation

## 2020-12-31 NOTE — Op Note (Signed)
12/31/2020  9:58 AM  PATIENT:  Sabrina Arroyo   MRN: [id number]  PRE-OPERATIVE DIAGNOSIS:  Primary localized osteoarthritis of left knee   POST-OPERATIVE DIAGNOSIS:  Same   PROCEDURE:  Procedure(s): Left TOTAL KNEE ARTHROPLASTY   SURGEON: Leitha Schuller, MD   ASSISTANTS: Cranston Neighbor, PA-C   ANESTHESIA:   spinal   EBL: 100 cc   BLOOD ADMINISTERED:none   DRAINS:  Incisional wound VAC     LOCAL MEDICATIONS USED:  MARCAINE    and OTHER Exparel and morphine   SPECIMEN:  No Specimen   DISPOSITION OF SPECIMEN:  N/A   COUNTS:  YES   TOURNIQUET: 68 minutes at 300 mm Hg   IMPLANTS: Medacta  GMK sphere system with 3+ left femur, left 3 tibia with short stem and 10 mm insert.  Size 2 patella, all components cemented.   DICTATION: Reubin Milan Dictation   patient was brought to the operating room and spinal anesthesia was obtained.  After prepping and draping the left leg in sterile fashion, and after patient identification and timeout procedures were completed, tourniquet was raised  and midline skin incision was made followed by medial parapatellar arthrotomy with severe medial compartment osteoarthritis, severe patellofemoral arthritis and advanced lateral compartment arthritis, partial synovectomy was also carried out.   The ACL and PCL and fat pad were excised along with anterior horns of the meniscus. The proximal tibia cutting guide from  the extra medullary alignment guide was applied and the proximal tibia cut carried out.  The distal femoral cut was carried out in a similar fashion using intramedullary guide resecting additional 2 mm secondary to flexion contracture.  The sizing guide was applied and it sized to a size 3+     the 3+ femoral cutting guide applied with anterior posterior and chamfer cuts made.  The posterior horns of the menisci were removed at this point.   Injection of the above medication was carried out after the femoral and tibial cuts were carried out.  The 3  baseplate trial was placed pinned into position and proximal tibial preparation carried out with drilling hand reaming and the keel punch followed by placement of the 3+ femur and sizing the tibial insert size 10 millimeter gave the best fit with stability and full extension.  The distal femoral drill holes were made in the notch cut for the trochlear groove was then carried out with trials were then removed the patella was cut using the patellar cutting guide and it sized to a size 2 after drill holes have been made  The knee was irrigated with pulsatile lavage and the bony surfaces dried the tibial component was cemented into place first.  Excess cement was removed and the polyethylene insert placed with a torque screw placed with a torque screwdriver tightened.  The distal femoral component was placed and the knee was held in extension as the patellar button was clamped into place.  After the cement was set, excess cement was removed and the knee was again irrigated thoroughly thoroughly irrigated.  The tourniquet was let down and hemostasis checked with electrocautery. The arthrotomy was repaired with a heavy Quill suture,  followed by 3-0 V lock subcuticular closure, skin staples followed by incisional wound VAC and Polar Care.Marland Kitchen   PLAN OF CARE: Admit for overnight observation   PATIENT DISPOSITION:  PACU - hemodynamically stable.

## 2020-12-31 NOTE — H&P (Signed)
Chief Complaint  Patient presents with   Pre-op Exam  Left TKA scheduled 12/31/20 by Dr. Rosita Kea    History of the Present Illness: LASHAWNTA Arroyo is a 70 y.o. female here today.   The patient presents for evaluation of left knee pain. X-ray obtained today. She had a prior right total knee arthroplasty in 2014. She has had corticosteroid gel injections on this left knee and comes in to discuss possible left knee arthroplasty.  The patient states her left knee pain is severe. She has difficulty putting on her underwear. She states her right knee clicks and gets stuck. She has been using a cart at the grocery store. She has had viscosupplementation injections in the past.   The patient states her health has been fine. She had COVID-19 about 3 weeks ago and was sick for about 2 weeks.  I have reviewed past medical, surgical, social and family history, and allergies as documented in the EMR.  Past Medical History: Past Medical History:  Diagnosis Date   Abnormal cytology 1979  Complete hysterectomy   Allergic state 1973?  Codeine   Arthritis   Chronic insomnia 07/09/2014  neurontin 300 qhs per neuro   Controlled type 2 diabetes mellitus with diabetic nephropathy, without long-term current use of insulin (CMS-HCC)   Controlled type 2 diabetes mellitus with diabetic nephropathy, without long-term current use of insulin (CMS-HCC)   Controlled type 2 diabetes mellitus with diabetic neuropathy (CMS-HCC)   Depression   Essential hypertension 08/13/2016   Family history of colon cancer 07/09/2014  Mother x 2   GERD (gastroesophageal reflux disease) 2015   History of abnormal cervical Pap smear 1979  Complete hysterectomy   History of cataract Being watched   Hypertriglyceridemia 07/09/2014  Lopid daily only   Irritable bowel syndrome with diarrhea 11/15/2014  Ongoing for many years Diagnosed by prior PCP in 2015   OSA on CPAP 04/14/2016  Cpap 11-12 cm Following now with [facility]  Last seen 04/08/16 Marnee Spring PA [phone]   Osteoarthritis of both knees 07/09/2014  Ortho, Duexis 800/26.5 tid   Osteopenia of necks of both femurs 11/28/2017  Your FRAX score was calculated and low. Major risk 7.9% Hip risk 1.1%   Sleep apnea   Past Surgical History: Past Surgical History:  Procedure Laterality Date   APPENDECTOMY 1979  taken with hysterectomy due to endometriosis   BREAST SURGERY   EYELID SURGERY 11/2017   HYSTERECTOMY 1979  complete, due to endometriosis   JOINT REPLACEMENT 072014  Right knee   SHOULDER SURGERY   Trigger Finger Release Right 07/07/2019  Rt Thumb, Dr, Rosita Kea   Past Family History: Family History  Problem Relation Age of Onset   Breast cancer Mother   Colon cancer Mother   Arthritis Mother   Macular degeneration Mother   Lupus Sister   Heart murmur Sister   Lupus Daughter   Lupus Daughter   Heart murmur Brother   Blindness Neg Hx   Glaucoma Neg Hx   Retinal detachment Neg Hx   Diabetes Neg Hx   High blood pressure (Hypertension) Neg Hx   Medications: Current Outpatient Medications Ordered in Epic  Medication Sig Dispense Refill   *biotin oral   *multivitamins oral   acetaminophen (TYLENOL) 650 MG ER tablet ONE Tablet as needed every 8 hours for joint pain, 90 DAYS 270 tablet 1   amoxicillin (AMOXIL) 500 MG capsule Take 1 capsule (500 mg total) by mouth as needed For dental procedures 8 capsule 0  ARIPiprazole (ABILIFY) 5 MG tablet Take 5 mg by mouth once daily   ascorbic acid, vitamin C, (VITAMIN C) 500 MG tablet Take 500 mg by mouth once daily   aspirin 81 MG EC tablet Take 81 mg by mouth once daily 30 tablet 11   cholecalciferol (VITAMIN D3) 1,000 unit capsule Take 2 capsules (2,000 Units total) by mouth.   dextroamphetamine-amphetamine (ADDERALL XR) 20 MG XR capsule Take 20 mg by mouth once daily   metFORMIN (GLUCOPHAGE-XR) 500 MG XR tablet Take 4 tablets (2,000 mg total) by mouth daily with dinner 360 tablet 3   omeprazole  (PRILOSEC) 20 MG DR capsule Take 1 capsule (20 mg total) by mouth once daily 90 capsule 3   pramipexole (MIRAPEX) 0.125 MG tablet Take by mouth once daily   rosuvastatin (CRESTOR) 40 MG tablet Take 1 tablet (40 mg total) by mouth at bedtime (replacing zocor) for cholesterol 90 tablet 3   semaglutide (OZEMPIC) 0.25 mg or 0.5 mg(2 mg/1.5 mL) pen injector Inject 0.1875 mLs (0.25 mg total) subcutaneously once a week for 30 days, THEN 0.375 mLs (0.5 mg total) once a week for 30 days. 2.25 mL 1   TRINTELLIX 20 mg Tab 0   No current Epic-ordered facility-administered medications on file.   Allergies: Allergies  Allergen Reactions   Codeine Rash and Itching    Body mass index is 32.93 kg/m.  Review of Systems: A comprehensive 14 point ROS was performed, reviewed, and the pertinent orthopaedic findings are documented in the HPI.  Vitals:  12/23/20 1038  BP: 120/80    General Physical Examination:   General/Constitutional: No apparent distress: well-nourished and well developed. Eyes: Pupils equal, round with synchronous movement. Lungs: Clear to auscultation HEENT: Normal Vascular: No edema, swelling or tenderness, except as noted in detailed exam. Cardiac: Heart rate and rhythm is regular. Integumentary: No impressive skin lesions present, except as noted in detailed exam. Neuro/Psych: Normal mood and affect, oriented to person, place and time.  On exam, left knee range of motion of 10-85 degrees. Palpable pulses.  Radiographs:  AP, lateral, standing, and sunrise x-rays of the left knee were ordered and personally reviewed today. These show complete loss of medial joint space with varus deformity, medial and lateral osteophytes. Lateral view shows large posterior osteophytes, as well as loose body in the posterior space, large anterior tibial osteophytes, as well as severe patellofemoral osteophytes. Sunrise view shows complete loss of joint space, medial and lateral, without  subluxation and extensive osteophytes.  X-ray Impression Severe tricompartmental osteoarthritis.  Assessment: ICD-10-CM  1. Primary osteoarthritis of left knee M17.12   Plan:  The patient has clinical findings of severe left knee osteoarthritis with exhausted nonoperative measures.  We discussed the patient's x-ray findings. I explained she has severe left knee osteoarthritis with exhausted nonoperative measures. We will plan for left total knee arthroplasty in the near future.  Surgical Risks:  The nature of the condition and the proposed procedure has been reviewed in detail with the patient. Surgical versus non-surgical options and prognosis for recovery have been reviewed and the inherent risks and benefits of each have been discussed including the risks of infection, bleeding, injury to nerves/blood vessels/tendons, incomplete relief of symptoms, persisting pain and/or stiffness, loss of function, complex regional pain syndrome, failure of the procedure, as appropriate.  Teeth: Normal  Scribe Attestation: I, Dawn Royse, am acting as Neurosurgeon for [employer], [place].   Electronically signed by Patience Musca, PA at 12/23/2020 11:36 AM EDT  Reviewed  H+P. No changes noted.

## 2020-12-31 NOTE — Anesthesia Postprocedure Evaluation (Signed)
Anesthesia Post Note  Patient: Peachie Barkalow  Procedure(s) Performed: TOTAL KNEE ARTHROPLASTY (Left: Knee)  Patient location during evaluation: PACU Anesthesia Type: Spinal Level of consciousness: oriented and awake and alert Pain management: pain level controlled Vital Signs Assessment: post-procedure vital signs reviewed and stable Respiratory status: spontaneous breathing, respiratory function stable and patient connected to nasal cannula oxygen Cardiovascular status: blood pressure returned to baseline and stable Postop Assessment: no headache, no backache and no apparent nausea or vomiting Anesthetic complications: no   No notable events documented.   Last Vitals:  Vitals:   12/31/20 1258 12/31/20 1317  BP: 120/72 119/85  Pulse: (!) 57 64  Resp: 16 16  Temp:  36.4 C  SpO2: 100% 99%    Last Pain:  Vitals:   12/31/20 1403  TempSrc:   PainSc: 2                  Foye Deer

## 2021-01-01 ENCOUNTER — Encounter: Payer: Self-pay | Admitting: Orthopedic Surgery

## 2021-01-01 DIAGNOSIS — M1712 Unilateral primary osteoarthritis, left knee: Secondary | ICD-10-CM | POA: Diagnosis not present

## 2021-01-01 LAB — CBC
HCT: 34.6 % — ABNORMAL LOW (ref 36.0–46.0)
Hemoglobin: 11.6 g/dL — ABNORMAL LOW (ref 12.0–15.0)
MCH: 29.5 pg (ref 26.0–34.0)
MCHC: 33.5 g/dL (ref 30.0–36.0)
MCV: 88 fL (ref 80.0–100.0)
Platelets: 150 10*3/uL (ref 150–400)
RBC: 3.93 MIL/uL (ref 3.87–5.11)
RDW: 13.6 % (ref 11.5–15.5)
WBC: 9.2 10*3/uL (ref 4.0–10.5)
nRBC: 0 % (ref 0.0–0.2)

## 2021-01-01 LAB — BASIC METABOLIC PANEL
Anion gap: 9 (ref 5–15)
BUN: 15 mg/dL (ref 8–23)
CO2: 25 mmol/L (ref 22–32)
Calcium: 8.5 mg/dL — ABNORMAL LOW (ref 8.9–10.3)
Chloride: 98 mmol/L (ref 98–111)
Creatinine, Ser: 0.88 mg/dL (ref 0.44–1.00)
GFR, Estimated: >60 mL/min (ref >60)
Glucose, Bld: 181 mg/dL — ABNORMAL HIGH (ref 70–99)
Potassium: 4.3 mmol/L (ref 3.5–5.1)
Sodium: 132 mmol/L — ABNORMAL LOW (ref 135–145)

## 2021-01-01 LAB — GLUCOSE, CAPILLARY
Glucose-Capillary: 153 mg/dL — ABNORMAL HIGH (ref 70–99)
Glucose-Capillary: 179 mg/dL — ABNORMAL HIGH (ref 70–99)
Glucose-Capillary: 175 mg/dL — ABNORMAL HIGH (ref 70–99)
Glucose-Capillary: 169 mg/dL — ABNORMAL HIGH (ref 70–99)

## 2021-01-01 MED ORDER — OXYCODONE HCL 5 MG PO TABS
10.0000 mg | ORAL_TABLET | ORAL | Status: DC | PRN
  Administered 2021-01-02 (×3): 10 mg via ORAL
  Filled 2021-01-01 (×3): qty 2

## 2021-01-01 NOTE — Evaluation (Signed)
Occupational Therapy Evaluation Patient Details Name: Sabrina Arroyo MRN: [id number] DOB: Feb 08, 1951 Today's Date: 01/01/2021   History of Present Illness The patient presents for L TKA.  She had a prior right total knee arthroplasty in 2014.   Clinical Impression   Chart reviewed, RN cleared pt for participation in OT evaluation s/p L TKA 12/31/20. Pt is greeted in bed,alert and oriented x4, agreeable to OT session. Pt reports 3/10 pain however endorses pain impeding progressing mobilty on this date. Pt lives alone, reports a friend from [place] is coming to assist her after discharge. Pt performs supine>sit with MOD A for LLE management, STS with CGA, SPT with CGA with RW with increased time, vcs for sequencing and safety. Pt performs grooming tasks in bedside chair with SET UP.  Pt currently requires MOD assist for LB dressing with AE while in seated position due to pain and limited AROM of L knee. Pt instructed in polar care mgt, falls prevention strategies, home/routines modifications, DME/AE for LB bathing and dressing tasks, and compression stocking mgt. Pt would benefit from skilled OT services including additional instruction in dressing techniques with or without assistive devices for dressing and bathing skills to support recall and carryover prior to discharge and ultimately to maximize safety, independence, and minimize falls risk and caregiver burden.OT recommends discharge home with HHOT to address functional deficits. Pt is left in bedside chair, NAD all needs met. OT will continue to follow while admitted.      Recommendations for follow up therapy are one component of a multi-disciplinary discharge planning process, led by the attending physician.  Recommendations may be updated based on patient status, additional functional criteria and insurance authorization.   Follow Up Recommendations  Home health OT    Assistance Recommended at Discharge Intermittent Supervision/Assistance   Functional Status Assessment  Patient has had a recent decline in their functional status and demonstrates the ability to make significant improvements in function in a reasonable and predictable amount of time.  Equipment Recommendations  BSC    Recommendations for Other Services       Precautions / Restrictions Precautions Precautions: Fall Restrictions Weight Bearing Restrictions: Yes LLE Weight Bearing: Weight bearing as tolerated      Mobility Bed Mobility Overal bed mobility: Needs Assistance Bed Mobility: Supine to Sit     Supine to sit: Mod assist     General bed mobility comments: for assistance with LLE    Transfers Overall transfer level: Needs assistance Equipment used: Rolling walker (2 wheels) Transfers: Sit to/from Stand;Stand Pivot Transfers Sit to Stand: Min guard Stand pivot transfers: Min guard         General transfer comment: vcs required for sequencing and safety      Balance Overall balance assessment: Needs assistance Sitting-balance support: Feet supported Sitting balance-Leahy Scale: Good     Standing balance support: Bilateral upper extremity supported;During functional activity Standing balance-Leahy Scale: Fair                             ADL either performed or assessed with clinical judgement   ADL Overall ADL's : Needs assistance/impaired Eating/Feeding: Independent   Grooming: Wash/dry hands;Wash/dry face;Oral care;Set up;Sitting Grooming Details (indicate cue type and reason): in bedside chair             Lower Body Dressing: Moderate assistance;Sitting/lateral leans Lower Body Dressing Details (indicate cue type and reason): with AE Toilet Transfer: Min Psychiatric nurse Details (indicate  cue type and reason): simulated with RW         Functional mobility during ADLs: Min guard;Rolling walker (2 wheels);Cueing for safety       Vision Baseline Vision/History: 1 Wears glasses        Perception     Praxis      Pertinent Vitals/Pain Pain Assessment: 0-10 Pain Score: 3  Pain Location: L knee Pain Descriptors / Indicators: Aching;Discomfort Pain Intervention(s): Limited activity within patient's tolerance;Monitored during session;Ice applied     Hand Dominance     Extremity/Trunk Assessment Upper Extremity Assessment Upper Extremity Assessment: Overall WFL for tasks assessed   Lower Extremity Assessment Lower Extremity Assessment: LLE deficits/detail LLE Deficits / Details: s/p L TKA       Communication Communication Communication: No difficulties   Cognition Arousal/Alertness: Awake/alert Behavior During Therapy: WFL for tasks assessed/performed Overall Cognitive Status: Within Functional Limits for tasks assessed                                 General Comments: Pt is alert and oriented x4; increased lethargy noted following functional transfer     General Comments       Exercises     Shoulder Instructions      Home Living Family/patient expects to be discharged to:: Private residence Living Arrangements: Alone Available Help at Discharge: Family;Friend(s);Available PRN/intermittently Type of Home: House Home Access: Stairs to enter [employer] of Steps: 3-4 Entrance Stairs-Rails: Right;Left;Can reach both Home Layout: One level         Biochemist, clinical: Standard     Home Equipment: Toilet riser          Prior Functioning/Environment Prior Level of Function : Independent/Modified Independent             Mobility Comments: without AE          OT Problem List: Impaired balance (sitting and/or standing);Decreased strength;Decreased knowledge of precautions;Pain;Decreased activity tolerance;Decreased knowledge of use of DME or AE      OT Treatment/Interventions: Self-care/ADL training;DME and/or AE instruction;Therapeutic activities;Balance training;Therapeutic exercise;Patient/family education     OT Goals(Current goals can be found in the care plan section) Acute Rehab OT Goals Patient Stated Goal: to go home OT Goal Formulation: With patient Time For Goal Achievement: 01/15/21 Potential to Achieve Goals: Good ADL Goals Pt Will Perform Grooming: with modified independence;standing Pt Will Perform Lower Body Dressing: with modified independence Pt Will Transfer to Toilet: with modified independence;bedside commode Pt Will Perform Toileting - Clothing Manipulation and hygiene: with modified independence;sit to/from stand  OT Frequency: Min 2X/week    AM-PAC OT "6 Clicks" Daily Activity     Outcome Measure Help from another person eating meals?: None Help from another person taking care of personal grooming?: A Little Help from another person toileting, which includes using toliet, bedpan, or urinal?: A Little Help from another person bathing (including washing, rinsing, drying)?: A Little Help from another person to put on and taking off regular upper body clothing?: A Little Help from another person to put on and taking off regular lower body clothing?: A Lot 6 Click Score: 18   End of Session Equipment Utilized During Treatment: Gait belt;Rolling walker (2 wheels) Nurse Communication: Mobility status  Activity Tolerance: Patient limited by lethargy;Patient limited by pain Patient left: in chair;with chair alarm set;with call bell/phone within reach  OT Visit Diagnosis: Other abnormalities of gait and mobility (R26.89);Unsteadiness on feet (R26.81)  Time: 0352-4818 OT Time Calculation (min): 40 min Charges:  OT General Charges $OT Visit: 1 Visit OT Evaluation $OT Eval Low Complexity: 1 Low OT Treatments $Self Care/Home Management : 23-37 mins  Shanon Payor, OTD OTR/L  01/01/21, 11:27 AM

## 2021-01-01 NOTE — Progress Notes (Signed)
Physical Therapy Treatment Patient Details Name: Sabrina Arroyo MRN: [id number] DOB: 06/16/1950 Today's Date: 01/01/2021   History of Present Illness The patient presents for L TKA.  She had a prior right total knee arthroplasty in 2014.    PT Comments    Patient received in recliner, RN in room as patient requesting to use bathroom. Patient assisted to [facility] with min assist. Then ambulated with rw 25 feet in room with min guard. Patient making slow progress with mobility due to pain and lethargy. She will continue to benefit from skilled PT while here to improve functional independence, strength and rom.      Recommendations for follow up therapy are one component of a multi-disciplinary discharge planning process, led by the attending physician.  Recommendations may be updated based on patient status, additional functional criteria and insurance authorization.  Follow Up Recommendations  Home health PT     Assistance Recommended at Discharge Intermittent Supervision/Assistance  Equipment Recommendations  Rolling walker (2 wheels)    Recommendations for Other Services       Precautions / Restrictions Precautions Precautions: Fall Restrictions Weight Bearing Restrictions: Yes LLE Weight Bearing: Weight bearing as tolerated     Mobility  Bed Mobility Overal bed mobility: Needs Assistance Bed Mobility: Sit to Supine     Supine to sit: Mod assist Sit to supine: Min assist   General bed mobility comments: min assist to get back into bed with L LE    Transfers Overall transfer level: Needs assistance Equipment used: Rolling walker (2 wheels) Transfers: Sit to/from Stand Sit to Stand: Supervision Stand pivot transfers: Min guard         General transfer comment: vcs required for sequencing and safety    Ambulation/Gait Ambulation/Gait assistance: Min guard Gait Distance (Feet): 25 Feet Assistive device: Rolling walker (2 wheels) Gait Pattern/deviations: Step-to  pattern;Decreased step length - right;Decreased step length - left;Decreased stride length;Decreased weight shift to left Gait velocity: decreased   General Gait Details: patient limited by pain, although only reporting a 3/10.   Stairs             Wheelchair Mobility    Modified Rankin (Stroke Patients Only)       Balance Overall balance assessment: Needs assistance Sitting-balance support: Feet supported Sitting balance-Leahy Scale: Good     Standing balance support: Bilateral upper extremity supported;During functional activity Standing balance-Leahy Scale: Fair Standing balance comment: Requiring B UE support and min guard                            Cognition Arousal/Alertness: Awake/alert Behavior During Therapy: WFL for tasks assessed/performed;Flat affect Overall Cognitive Status: Within Functional Limits for tasks assessed                                 General Comments: less groggy this pm compared to AM        Exercises Total Joint Exercises Ankle Circles/Pumps: AROM;Both;10 reps Quad Sets: AROM;Left;5 reps Heel Slides: AAROM;Left;5 reps Hip ABduction/ADduction: AAROM;Left;10 reps Straight Leg Raises: AAROM;Left;10 reps Long Arc Quad: AAROM;Left;5 reps Goniometric ROM: 0-65 pain limited    General Comments        Pertinent Vitals/Pain Pain Assessment: 0-10 Pain Score: 3  Pain Location: L knee Pain Descriptors / Indicators: Aching;Discomfort;Sore Pain Intervention(s): Limited activity within patient's tolerance;Monitored during session;Repositioned;Ice applied    Home Living Family/patient expects to be  discharged to:: Private residence Living Arrangements: Alone Available Help at Discharge: Family;Friend(s);Available PRN/intermittently Type of Home: House Home Access: Stairs to enter Entrance Stairs-Rails: Right;Left;Can reach both Entrance Stairs-Number of Steps: 3-4   Home Layout: One level Home Equipment:  Toilet riser      Prior Function            PT Goals (current goals can now be found in the care plan section) Acute Rehab PT Goals Patient Stated Goal: to return home, decrease pain PT Goal Formulation: With patient Time For Goal Achievement: 01/04/21 Potential to Achieve Goals: Fair Progress towards PT goals: Progressing toward goals    Frequency    BID      PT Plan Current plan remains appropriate    Co-evaluation              AM-PAC PT "6 Clicks" Mobility   Outcome Measure  Help needed turning from your back to your side while in a flat bed without using bedrails?: A Little Help needed moving from lying on your back to sitting on the side of a flat bed without using bedrails?: A Little Help needed moving to and from a bed to a chair (including a wheelchair)?: A Little Help needed standing up from a chair using your arms (e.g., wheelchair or bedside chair)?: A Little Help needed to walk in hospital room?: A Little Help needed climbing 3-5 steps with a railing? : A Lot 6 Click Score: 17    End of Session Equipment Utilized During Treatment: Gait belt Activity Tolerance: Patient limited by pain;Patient limited by fatigue Patient left: in bed;with call bell/phone within reach;with bed alarm set;with SCD's reapplied Nurse Communication: Mobility status PT Visit Diagnosis: Other abnormalities of gait and mobility (R26.89);Muscle weakness (generalized) (M62.81);Difficulty in walking, not elsewhere classified (R26.2);Pain Pain - Right/Left: Left Pain - part of body: Knee     Time: 6387-5643 PT Time Calculation (min) (ACUTE ONLY): 27 min  Charges:  $Gait Training: 8-22 mins $Therapeutic Exercise: 8-22 mins                     [employer], PT, GCS 01/01/21,2:43 PM

## 2021-01-01 NOTE — TOC Initial Note (Signed)
Transition of Care [facility]) - Initial/Assessment Note    Patient Details  Name: Sabrina Arroyo MRN: [id number] Date of Birth: 1950/04/21  Transition of Care [facility]) CM/SW Contact:    Caryn Section, RN Phone Number: 01/01/2021, 12:42 PM  Clinical Narrative:    Patient reportedly lives alone, but states she has a friend who can stay with her to assist her when she returns home.  She has no concerns about getting to appointments or getting medications and states she can take medications as directed.  Patient is set up with [facility] prior to admission for PT and OT after discharge.  She states she has a bedside commode, but asks for walker to be delivered.  During interview, patient was lethargic and states she was in a lot of pain.  Care team alerted to this need.  Will await PT notes to ensure disposition and equipment needs.               Expected Discharge Plan: Home w Home Health Services Barriers to Discharge: Continued Medical Work up   Patient Goals and CMS Choice     Choice offered to / list presented to : NA  Expected Discharge Plan and Services Expected Discharge Plan: Home w Home Health Services   Discharge Planning Services: CM Consult Post Acute Care Choice: NA Living arrangements for the past 2 months: Single Family Home                 DME Arranged:  (Pending PT notes)         HH Arranged: PT, OT [facility] HH Agency Contacted: 01/01/21 Time HH Agency Contacted: 1241 Representative spoke with at [facility] Agency: as per [place]  Prior Living Arrangements/Services Living arrangements for the past 2 months: Single Family Home Lives with:: Self Patient language and need for interpreter reviewed:: Yes (Interpreter not needed) Do you feel safe going back to the place where you live?: Yes      Need for Family Participation in Patient Care: Yes (Comment)     Criminal Activity/Legal Involvement Pertinent to Current  Situation/Hospitalization: No - Comment as needed  Activities of Daily Living Home Assistive Devices/Equipment: CPAP, Eyeglasses, CBG Meter ADL Screening (condition at time of admission) Patient's cognitive ability adequate to safely complete daily activities?: Yes Is the patient deaf or have difficulty hearing?: Yes Does the patient have difficulty seeing, even when wearing glasses/contacts?: No Does the patient have difficulty concentrating, remembering, or making decisions?: No Patient able to express need for assistance with ADLs?: Yes Does the patient have difficulty dressing or bathing?: No Independently performs ADLs?: Yes (appropriate for developmental age) Does the patient have difficulty walking or climbing stairs?: Yes Weakness of Legs: Left Weakness of Arms/Hands: None  Permission Sought/Granted Permission sought to share information with : Case Manager Permission granted to share information with : Yes, Verbal Permission Granted     Permission granted to share info w AGENCY: [facility] and D[facility]        Emotional Assessment Appearance:: Appears stated age Attitude/Demeanor/Rapport: Engaged, Lethargic Affect (typically observed): Appropriate, Pleasant Orientation: : Oriented to Self, Oriented to Place, Oriented to  Time, Oriented to Situation Alcohol / Substance Use: Not Applicable Psych Involvement: No (comment)  Admission diagnosis:  S/P TKR (total knee replacement) using cement, left [Z96.652] Patient Active Problem List   Diagnosis Date Noted   S/P TKR (total knee replacement) using cement, left 12/31/2020   PCP:  Durwin Glaze,  Howell Pringle, MD Pharmacy:   [id number] - [place], [place] - [address] AT [facility] [address] [place] [address] Phone: [phone] Fax: [phone]     Social Determinants of Health (SDOH) Interventions    Readmission Risk Interventions No  flowsheet data found.

## 2021-01-01 NOTE — Progress Notes (Signed)
Physical Therapy Treatment Patient Details Name: Sabrina Arroyo MRN: [id number] DOB: [birth year] Today's Date: 01/01/2021   History of Present Illness The patient presents for L TKA.  She had a prior right total knee arthroplasty in 2014.    PT Comments    Patient received up in recliner. Appears very groggy, but agrees to PT session. Patient reports pain at 3/10, but is quite pain limited with mobility and movement of left leg. She is unable to tolerate much in the way of knee flexion. Patient requires supervision/min guard for sit to stand and min guard for ambulation of 10 feet with RW. Slow pace with verbal cues needed for sequencing. Patient will continue to benefit from skilled PT while here to improve mobility, strength and safety.      Recommendations for follow up therapy are one component of a multi-disciplinary discharge planning process, led by the attending physician.  Recommendations may be updated based on patient status, additional functional criteria and insurance authorization.  Follow Up Recommendations  Home health PT     Assistance Recommended at Discharge Intermittent Supervision/Assistance  Equipment Recommendations  3in1 (PT);Rolling walker (2 wheels)    Recommendations for Other Services       Precautions / Restrictions Precautions Precautions: Fall Restrictions Weight Bearing Restrictions: Yes LLE Weight Bearing: Weight bearing as tolerated     Mobility  Bed Mobility Overal bed mobility: Needs Assistance Bed Mobility: Supine to Sit     Supine to sit: Mod assist     General bed mobility comments: Patient received in recliner and remained in recliner at end of session    Transfers Overall transfer level: Needs assistance Equipment used: Rolling walker (2 wheels) Transfers: Sit to/from Stand Sit to Stand: Supervision Stand pivot transfers: Min guard         General transfer comment: vcs required for sequencing and safety     Ambulation/Gait Ambulation/Gait assistance: Min guard Gait Distance (Feet): 10 Feet Assistive device: Rolling walker (2 wheels) Gait Pattern/deviations: Step-to pattern;Decreased step length - right;Decreased step length - left;Decreased stride length Gait velocity: decreased   General Gait Details: patient limited by pain, although only reporting a 3/10.   Stairs             Wheelchair Mobility    Modified Rankin (Stroke Patients Only)       Balance Overall balance assessment: Needs assistance Sitting-balance support: Feet supported Sitting balance-Leahy Scale: Good     Standing balance support: Bilateral upper extremity supported;During functional activity Standing balance-Leahy Scale: Fair Standing balance comment: Requiring B UE support and min guard                            Cognition Arousal/Alertness: Lethargic Behavior During Therapy: WFL for tasks assessed/performed;Flat affect Overall Cognitive Status: Within Functional Limits for tasks assessed                                 General Comments: Patient is groggy/lethargic throughout session.        Exercises Total Joint Exercises Ankle Circles/Pumps: AROM;Both;10 reps Quad Sets: AROM;Left;5 reps Hip ABduction/ADduction: AAROM;Left;10 reps Straight Leg Raises: AAROM;Left;10 reps Long Arc Quad: AAROM;Left;5 reps Goniometric ROM: 0-65 pain limited    General Comments        Pertinent Vitals/Pain Pain Assessment: 0-10 Pain Score: 3  Pain Location: L knee Pain Descriptors / Indicators: Aching;Discomfort;Sore Pain Intervention(s): Monitored during  session;Limited activity within patient's tolerance;Repositioned;Ice applied    Home Living Family/patient expects to be discharged to:: Private residence Living Arrangements: Alone Available Help at Discharge: Family;Friend(s);Available PRN/intermittently Type of Home: House Home Access: Stairs to enter Entrance  Stairs-Rails: Right;Left;Can reach both Entrance Stairs-Number of Steps: 3-4   Home Layout: One level Home Equipment: Toilet riser      Prior Function            PT Goals (current goals can now be found in the care plan section) Acute Rehab PT Goals Patient Stated Goal: to return home, decrease pain PT Goal Formulation: With patient Time For Goal Achievement: 01/04/21 Potential to Achieve Goals: Fair Progress towards PT goals: Progressing toward goals    Frequency    BID      PT Plan      Co-evaluation              AM-PAC PT "6 Clicks" Mobility   Outcome Measure  Help needed turning from your back to your side while in a flat bed without using bedrails?: A Little Help needed moving from lying on your back to sitting on the side of a flat bed without using bedrails?: A Little Help needed moving to and from a bed to a chair (including a wheelchair)?: A Little Help needed standing up from a chair using your arms (e.g., wheelchair or bedside chair)?: A Little Help needed to walk in hospital room?: A Little Help needed climbing 3-5 steps with a railing? : Total 6 Click Score: 16    End of Session Equipment Utilized During Treatment: Gait belt Activity Tolerance: Patient limited by pain;Patient limited by fatigue Patient left: in chair;with call bell/phone within reach Nurse Communication: Mobility status PT Visit Diagnosis: Other abnormalities of gait and mobility (R26.89);Muscle weakness (generalized) (M62.81);Difficulty in walking, not elsewhere classified (R26.2);Pain Pain - Right/Left: Left Pain - part of body: Knee     Time: 1137-1200 PT Time Calculation (min) (ACUTE ONLY): 23 min  Charges:  $Gait Training: 8-22 mins $Therapeutic Exercise: 8-22 mins                     [employer], PT, GCS 01/01/21,12:44 PM

## 2021-01-01 NOTE — Progress Notes (Signed)
   Subjective: 1 Day Post-Op Procedure(s) (LRB): TOTAL KNEE ARTHROPLASTY (Left) Patient reports pain as 5 on 0-10 scale.   Patient is well, and has had no acute complaints or problems Denies any CP, SOB, ABD pain. We will continue therapy today.  Plan is to go Home after hospital stay.  Objective: Vital signs in last 24 hours: Temp:  [96.8 F (36 C)-100 F (37.8 C)] 97.9 F (36.6 C) (11/02 0801) Pulse Rate:  [51-78] 78 (11/02 0801) Resp:  [10-21] 20 (11/02 0801) BP: (96-123)/(57-85) 102/57 (11/02 0801) SpO2:  [92 %-100 %] 96 % (11/02 0801)  Intake/Output from previous day: 11/01 0701 - 11/02 0700 In: 2993.3 [P.O.:640; I.V.:2153.3; IV Piggyback:200] Out: 1700 [Urine:1650; Blood:50] Intake/Output this shift: No intake/output data recorded.  Recent Labs    12/31/20 1338 01/01/21 0501  HGB 12.3 11.6*   Recent Labs    12/31/20 1338 01/01/21 0501  WBC 9.2 9.2  RBC 4.45 3.93  HCT 38.1 34.6*  PLT 168 150   Recent Labs    12/31/20 1338 01/01/21 0501  NA  --  132*  K  --  4.3  CL  --  98  CO2  --  25  BUN  --  15  CREATININE 0.93 0.88  GLUCOSE  --  181*  CALCIUM  --  8.5*   No results for input(s): LABPT, INR in the last 72 hours.  EXAM General - Patient is Alert, Appropriate, and Oriented Extremity - Neurovascular intact Sensation intact distally Intact pulses distally Dorsiflexion/Plantar flexion intact Dressing - dressing C/D/I and no drainage, Praveena intact without drainage Motor Function - intact, moving foot and toes well on exam.   Past Medical History:  Diagnosis Date   Arthritis    Depression    Diabetes mellitus without complication (HCC)    Sleep apnea     Assessment/Plan:   1 Day Post-Op Procedure(s) (LRB): TOTAL KNEE ARTHROPLASTY (Left) Active Problems:   S/P TKR (total knee replacement) using cement, left  Estimated body mass index is 33.13 kg/m as calculated from the following:   Height as of this encounter: 5\' 6"  (1.676 m).    Weight as of this encounter: 93.1 kg. Advance diet Up with therapy Work on bowel movement Labs are stable Vital signs are stable, BP soft.  Patient asymptomatic.  Continue to monitor Pain well controlled with Norco. Care management to assist with discharge to home with home health PT pending completion of PT goals  DVT Prophylaxis - Lovenox, Foot Pumps, and TED hose Weight-Bearing as tolerated to left leg   T. , PA-C [facility] 01/01/2021, 8:14 AM

## 2021-01-01 NOTE — Progress Notes (Signed)
Daughter expressed concern regarding pt pain and pain medicine, daughter inquired reason to why pt has not receive narcotic pain medicine, educated and informed daughter that pt has been lethargic all day through pt/ot sessions every time I walked into the room she was asleep, I gave her her schedule Tramadol for pain control. Daughter was not pleased with explantation and strongly suggested her mother received stronger pain medication in the past for past surgeries. Day explained to daughter that surgeons or attending DR. Will be informed if current pain medication on MAR is not effective. Reported to night RN.

## 2021-01-02 DIAGNOSIS — M1712 Unilateral primary osteoarthritis, left knee: Secondary | ICD-10-CM | POA: Diagnosis not present

## 2021-01-02 LAB — GLUCOSE, CAPILLARY
Glucose-Capillary: 172 mg/dL — ABNORMAL HIGH (ref 70–99)
Glucose-Capillary: 157 mg/dL — ABNORMAL HIGH (ref 70–99)

## 2021-01-02 MED ORDER — ENOXAPARIN SODIUM 40 MG/0.4ML IJ SOSY: 40.0000 mg | PREFILLED_SYRINGE | INTRAMUSCULAR | 0 refills | Status: DC

## 2021-01-02 MED ORDER — DOCUSATE SODIUM 100 MG PO CAPS: 100.0000 mg | ORAL_CAPSULE | Freq: Two times a day (BID) | ORAL | 0 refills | Status: DC

## 2021-01-02 MED ORDER — OXYCODONE HCL 5 MG PO TABS
5.0000 mg | ORAL_TABLET | ORAL | 0 refills | Status: DC | PRN
Start: 2021-01-02 — End: 2021-03-24

## 2021-01-02 MED ORDER — TRAMADOL HCL 50 MG PO TABS
50.0000 mg | ORAL_TABLET | Freq: Four times a day (QID) | ORAL | 0 refills | Status: DC | PRN
Start: 2021-01-02 — End: 2021-03-28

## 2021-01-02 MED ORDER — METHOCARBAMOL 500 MG PO TABS: 500.0000 mg | ORAL_TABLET | Freq: Four times a day (QID) | ORAL | 0 refills | Status: DC | PRN

## 2021-01-02 MED ORDER — POLYETHYLENE GLYCOL 3350 17 G PO PACK: 17.0000 g | PACK | Freq: Every day | ORAL | 0 refills | Status: DC | PRN

## 2021-01-02 NOTE — Progress Notes (Addendum)
Physical Therapy Treatment Patient Details Name: Sabrina Arroyo MRN: [id number] DOB: 1950-11-23 Today's Date: 01/02/2021   History of Present Illness The patient presents for L TKA.  She had a prior right total knee arthroplasty in 2014.    PT Comments    Patient received in recliner, agrees to PT. Just finished lunch. Patient more alert, but continues to be slow with mobility and to respond at times. Reports pain as 1.5 initially, just given pain medicine. She is able to progress ambulation to 75 feet with RW and supervision. Patient requires physical assist with HEP due to pain in left LE. Exercise handout given. She will continue to benefit from skilled PT while here to improve functional independence, strength and Rom.      Recommendations for follow up therapy are one component of a multi-disciplinary discharge planning process, led by the attending physician.  Recommendations may be updated based on patient status, additional functional criteria and insurance authorization.  Follow Up Recommendations  Home health PT     Assistance Recommended at Discharge Intermittent Supervision/Assistance  Equipment Recommendations  Rolling walker (2 wheels);3in1 (PT)    Recommendations for Other Services       Precautions / Restrictions Precautions Precautions: Fall Restrictions Weight Bearing Restrictions: Yes LLE Weight Bearing: Weight bearing as tolerated     Mobility  Bed Mobility Overal bed mobility: Needs Assistance Bed Mobility: Sit to Supine       Sit to supine: Min assist   General bed mobility comments: patient received sitting up on side of bed with OT present    Transfers Overall transfer level: Needs assistance Equipment used: Rolling walker (2 wheels) Transfers: Sit to/from Stand Sit to Stand: Supervision                Ambulation/Gait Ambulation/Gait assistance: Supervision Gait Distance (Feet): 75 Feet Assistive device: Rolling walker (2  wheels) Gait Pattern/deviations: Step-to pattern;Decreased step length - right;Decreased step length - left;Decreased weight shift to left Gait velocity: decreased   General Gait Details: Patient reports painful with mobility, but rats pain low on scale. Slow with mobility. Flat affect.   Stairs Stairs: Yes Stairs assistance: Supervision Stair Management: Step to pattern;Two rails;Forwards Number of Stairs: 4 General stair comments: cues needed for sequencing. Safe with supervision   Wheelchair Mobility    Modified Rankin (Stroke Patients Only)       Balance Overall balance assessment: Modified Independent Sitting-balance support: Feet supported Sitting balance-Leahy Scale: Normal     Standing balance support: Bilateral upper extremity supported;During functional activity Standing balance-Leahy Scale: Good Standing balance comment: supervision                            Cognition Arousal/Alertness: Awake/alert Behavior During Therapy: WFL for tasks assessed/performed Overall Cognitive Status: Within Functional Limits for tasks assessed                                          Exercises Total Joint Exercises Ankle Circles/Pumps: AROM;10 reps;Both Quad Sets: AROM;Left;10 reps Short Arc QuadBarbaraann Boys;Left;10 reps Heel Slides: AAROM;Left;10 reps Hip ABduction/ADduction: AAROM;Left;5 reps Straight Leg Raises: AAROM;5 reps;Left Long Arc Quad: AROM;Left;5 reps Goniometric ROM: 0-70    General Comments        Pertinent Vitals/Pain Pain Assessment: 0-10 Pain Score: 2  Pain Location: L knee Pain Descriptors / Indicators: Aching;Discomfort;Grimacing;Guarding;Sore Pain Intervention(s):  Repositioned;Ice applied;Limited activity within patient's tolerance;Monitored during session;Premedicated before session    Home Living                          Prior Function            PT Goals (current goals can now be found in the care  plan section) Acute Rehab PT Goals Patient Stated Goal: to return home, decrease pain PT Goal Formulation: With patient Time For Goal Achievement: 01/04/21 Potential to Achieve Goals: Good Progress towards PT goals: Progressing toward goals    Frequency    BID      PT Plan Current plan remains appropriate    Co-evaluation              AM-PAC PT "6 Clicks" Mobility   Outcome Measure  Help needed turning from your back to your side while in a flat bed without using bedrails?: A Little Help needed moving from lying on your back to sitting on the side of a flat bed without using bedrails?: A Little Help needed moving to and from a bed to a chair (including a wheelchair)?: A Little Help needed standing up from a chair using your arms (e.g., wheelchair or bedside chair)?: A Little Help needed to walk in hospital room?: A Little Help needed climbing 3-5 steps with a railing? : A Little 6 Click Score: 18    End of Session Equipment Utilized During Treatment: Gait belt Activity Tolerance: Patient limited by pain;Patient limited by fatigue Patient left: in bed;with bed alarm set;with family/visitor present Nurse Communication: Mobility status PT Visit Diagnosis: Muscle weakness (generalized) (M62.81);Pain;Other abnormalities of gait and mobility (R26.89) Pain - Right/Left: Left Pain - part of body: Knee     Time: 1330-1409 PT Time Calculation (min) (ACUTE ONLY): 39 min  Charges:  $Gait Training: 23-37 mins $Therapeutic Exercise: 8-22 mins                     Lleyton Byers, PT, GCS 01/02/21,2:18 PM

## 2021-01-02 NOTE — Plan of Care (Signed)
No acute events during the night. VSS. Up with assist x 2 to [facility]. PRN tylenol x 1 for pain. Patient's daughter inquiring about why patient has not received oxycodone her for her pain. I explained to her that patient receives pain medication based on her pain rating. At that time her pain was a 3 which warranted only tylenol. She was okay with this, but states she will return today to talk to the doctor.  Problem: Education: Goal: Knowledge of General Education information will improve Description: Including pain rating scale, medication(s)/side effects and non-pharmacologic comfort measures Outcome: Progressing   Problem: Health Behavior/Discharge Planning: Goal: Ability to manage health-related needs will improve Outcome: Progressing   Problem: Clinical Measurements: Goal: Ability to maintain clinical measurements within normal limits will improve Outcome: Progressing Goal: Will remain free from infection Outcome: Progressing Goal: Diagnostic test results will improve Outcome: Progressing Goal: Respiratory complications will improve Outcome: Progressing Goal: Cardiovascular complication will be avoided Outcome: Progressing   Problem: Activity: Goal: Risk for activity intolerance will decrease Outcome: Progressing   Problem: Nutrition: Goal: Adequate nutrition will be maintained Outcome: Progressing   Problem: Coping: Goal: Level of anxiety will decrease Outcome: Progressing   Problem: Elimination: Goal: Will not experience complications related to bowel motility Outcome: Progressing Goal: Will not experience complications related to urinary retention Outcome: Progressing   Problem: Pain Managment: Goal: General experience of comfort will improve Outcome: Progressing   Problem: Safety: Goal: Ability to remain free from injury will improve Outcome: Progressing   Problem: Skin Integrity: Goal: Risk for impaired skin integrity will decrease Outcome: Progressing    Problem: Education: Goal: Knowledge of the prescribed therapeutic regimen will improve Outcome: Progressing Goal: Individualized Educational Video(s) Outcome: Progressing   Problem: Activity: Goal: Ability to avoid complications of mobility impairment will improve Outcome: Progressing Goal: Range of joint motion will improve Outcome: Progressing   Problem: Clinical Measurements: Goal: Postoperative complications will be avoided or minimized Outcome: Progressing   Problem: Pain Management: Goal: Pain level will decrease with appropriate interventions Outcome: Progressing   Problem: Skin Integrity: Goal: Will show signs of wound healing Outcome: Progressing

## 2021-01-02 NOTE — Discharge Instructions (Signed)

## 2021-01-02 NOTE — Progress Notes (Addendum)
Discharge instructions (including medications) discussed with and copy provided to patient/caregiver. All DME sent with patient and belongings. [place] switched to [facility].

## 2021-01-02 NOTE — Progress Notes (Signed)
   Subjective: 2 Days Post-Op Procedure(s) (LRB): TOTAL KNEE ARTHROPLASTY (Left) Patient reports pain as mild and moderate.   Patient is well, and has had no acute complaints or problems Denies any CP, SOB, ABD pain. We will continue physical therapy today.  Plan is to go Home after hospital stay.  Objective: Vital signs in last 24 hours: Temp:  [98.4 F (36.9 C)-99.7 F (37.6 C)] 98.4 F (36.9 C) (11/03 0814) Pulse Rate:  [81-110] 95 (11/03 0814) Resp:  [15-18] 15 (11/03 0814) BP: (100-126)/(51-81) 100/51 (11/03 0814) SpO2:  [94 %-96 %] 95 % (11/03 0814)  Intake/Output from previous day: 11/02 0701 - 11/03 0700 In: -  Out: 450 [Urine:450] Intake/Output this shift: No intake/output data recorded.  Recent Labs    12/31/20 1338 01/01/21 0501  HGB 12.3 11.6*   Recent Labs    12/31/20 1338 01/01/21 0501  WBC 9.2 9.2  RBC 4.45 3.93  HCT 38.1 34.6*  PLT 168 150   Recent Labs    12/31/20 1338 01/01/21 0501  NA  --  132*  K  --  4.3  CL  --  98  CO2  --  25  BUN  --  15  CREATININE 0.93 0.88  GLUCOSE  --  181*  CALCIUM  --  8.5*   No results for input(s): LABPT, INR in the last 72 hours.  EXAM General - Patient is Alert, Appropriate, and Oriented Extremity - Neurovascular intact Sensation intact distally Intact pulses distally Dorsiflexion/Plantar flexion intact Dressing - dressing C/D/I and no drainage, Praveena intact without drainage Motor Function - intact, moving foot and toes well on exam.   Past Medical History:  Diagnosis Date   Arthritis    Depression    Diabetes mellitus without complication (HCC)    Sleep apnea     Assessment/Plan:   2 Days Post-Op Procedure(s) (LRB): TOTAL KNEE ARTHROPLASTY (Left) Active Problems:   S/P TKR (total knee replacement) using cement, left  Estimated body mass index is 33.13 kg/m as calculated from the following:   Height as of this encounter: 5\' 6"  (1.676 m).   Weight as of this encounter: 93.1  kg. Advance diet Up with therapy Work on bowel movement Labs are stable Vital signs are stable, BP soft.  Patient asymptomatic.  Continue to monitor Pain well controlled with Norco. Care management to assist with discharge to home with home health PT today pending completion of stairs/safe ambulation with PT  DVT Prophylaxis - Lovenox, Foot Pumps, and TED hose Weight-Bearing as tolerated to left leg   T. , PA-C [facility] 01/02/2021, 9:15 AM

## 2021-01-02 NOTE — Progress Notes (Signed)
Physical Therapy Treatment Patient Details Name: Sabrina Arroyo MRN: [id number] DOB: 1950/10/21 Today's Date: 01/02/2021   History of Present Illness The patient presents for L TKA.  She had a prior right total knee arthroplasty in 2014.    PT Comments    Patient received on edge of bed with OT. Agrees to ambulate. She continues to report mild to moderate pain. Received pain medicine as our session began. Patient requires supervision/min guard for sit to stand, supervision for ambulation with RW 50 feet and up/down 4 steps with B Rails. Patient requires cues for safety/hand placement and sequencing on steps. She is making progress toward goals and will be able to return home with assistance when appropriate.  While here she will continue to benefit from skilled PT for continued mobility, strengthening and ROM.        Recommendations for follow up therapy are one component of a multi-disciplinary discharge planning process, led by the attending physician.  Recommendations may be updated based on patient status, additional functional criteria and insurance authorization.  Follow Up Recommendations  Home health PT     Assistance Recommended at Discharge Intermittent Supervision/Assistance  Equipment Recommendations  Rolling walker (2 wheels)    Recommendations for Other Services       Precautions / Restrictions Precautions Precautions: Fall Restrictions Weight Bearing Restrictions: Yes LLE Weight Bearing: Weight bearing as tolerated     Mobility  Bed Mobility Overal bed mobility: Needs Assistance Bed Mobility: Supine to Sit     Supine to sit: Min assist;Mod assist;HOB elevated     General bed mobility comments: patient received sitting up on side of bed with OT present    Transfers Overall transfer level: Needs assistance Equipment used: Rolling walker (2 wheels) Transfers: Sit to/from Stand Sit to Stand: Supervision           General transfer comment: NT, PT in  room to assess    Ambulation/Gait Ambulation/Gait assistance: Supervision Gait Distance (Feet): 50 Feet Assistive device: Rolling walker (2 wheels) Gait Pattern/deviations: Step-to pattern;Decreased step length - right;Decreased step length - left;Decreased weight shift to left Gait velocity: decreased   General Gait Details: patient declined ambulating further at this time. Reports 5/10 pain. Slightly improved cadence and ability this session   Stairs Stairs: Yes Stairs assistance: Supervision Stair Management: Step to pattern;Two rails;Forwards Number of Stairs: 4 General stair comments: cues needed for sequencing. Safe with supervision   Wheelchair Mobility    Modified Rankin (Stroke Patients Only)       Balance Overall balance assessment: Needs assistance Sitting-balance support: Feet supported Sitting balance-Leahy Scale: Normal     Standing balance support: Bilateral upper extremity supported;During functional activity Standing balance-Leahy Scale: Good Standing balance comment: supervision                            Cognition Arousal/Alertness: Awake/alert Behavior During Therapy: WFL for tasks assessed/performed;Flat affect Overall Cognitive Status: Within Functional Limits for tasks assessed                                 General Comments: flat, quiet, responds to questions appropriately        Exercises Total Joint Exercises Ankle Circles/Pumps: AROM;Both;10 reps Quad Sets: AROM;Both;10 reps Short Arc QuadBarbaraann Boys;Left;10 reps Heel Slides: AAROM;Left;10 reps Hip ABduction/ADduction: AAROM;Left;10 reps Straight Leg Raises: AAROM;Left;10 reps Long Arc Quad: AROM;Left;5 reps Goniometric ROM: 0-70 Other  Exercises Other Exercises: Pt instructed in bed mobility, active pain assessments to supoprt optimal pain control coordination    General Comments        Pertinent Vitals/Pain Pain Assessment: 0-10 Pain Score: 5  Pain  Location: L knee Pain Descriptors / Indicators: Aching;Discomfort;Grimacing;Guarding;Sore Pain Intervention(s): Monitored during session;Limited activity within patient's tolerance;RN gave pain meds during session;Repositioned;Ice applied    Home Living                          Prior Function            PT Goals (current goals can now be found in the care plan section) Acute Rehab PT Goals Patient Stated Goal: to return home, decrease pain PT Goal Formulation: With patient Time For Goal Achievement: 01/04/21 Potential to Achieve Goals: Good Progress towards PT goals: Progressing toward goals    Frequency    BID      PT Plan Current plan remains appropriate    Co-evaluation              AM-PAC PT "6 Clicks" Mobility   Outcome Measure  Help needed turning from your back to your side while in a flat bed without using bedrails?: A Little Help needed moving from lying on your back to sitting on the side of a flat bed without using bedrails?: A Little Help needed moving to and from a bed to a chair (including a wheelchair)?: A Little Help needed standing up from a chair using your arms (e.g., wheelchair or bedside chair)?: A Little Help needed to walk in hospital room?: A Little Help needed climbing 3-5 steps with a railing? : A Little 6 Click Score: 18    End of Session Equipment Utilized During Treatment: Gait belt Activity Tolerance: Patient limited by pain;Patient limited by fatigue Patient left: in chair;with call bell/phone within reach;with chair alarm set;with family/visitor present Nurse Communication: Mobility status PT Visit Diagnosis: Muscle weakness (generalized) (M62.81);Pain;Other abnormalities of gait and mobility (R26.89) Pain - Right/Left: Left Pain - part of body: Knee     Time: 1000-1030 PT Time Calculation (min) (ACUTE ONLY): 30 min  Charges:  $Gait Training: 8-22 mins $Therapeutic Exercise: 8-22 mins                      Demarkus Remmel, PT, GCS 01/02/21,11:07 AM

## 2021-01-02 NOTE — Progress Notes (Signed)
Occupational Therapy Treatment Patient Details Name: Sabrina Arroyo MRN: [id number] DOB: [birth year] Today's Date: 01/02/2021   History of present illness The patient presents for L TKA.  She had a prior right total knee arthroplasty in 2014.   OT comments  Pt seen for OT tx this date. Pt in bed, endorsing increased L knee pain, in bone foam. Pt required MIN A to elevate LLE to get out of bone foam. MIN-MOD A for LLE mgt to assist getting from supine to sitting EOB, requiring significantly increased time/effort. Once seated EOB, pt endorsing "more pain than I expected and I have a high pain tolerance." RN notified and brought pain meds, 5/10 L knee. Pt educated in active pain assessments to better support optimal pain control both in the hospital and at home. Pt verbalized understanding. Pt left seated EOB with PT for additional mobility. Pt continues to benefit from skilled OT services. Continue to recommend HHOT.    Recommendations for follow up therapy are one component of a multi-disciplinary discharge planning process, led by the attending physician.  Recommendations may be updated based on patient status, additional functional criteria and insurance authorization.    Follow Up Recommendations  Home health OT    Assistance Recommended at Discharge Intermittent Supervision/Assistance  Equipment Recommendations  [facility]    Recommendations for Other Services      Precautions / Restrictions Precautions Precautions: Fall Restrictions Weight Bearing Restrictions: Yes LLE Weight Bearing: Weight bearing as tolerated       Mobility Bed Mobility Overal bed mobility: Needs Assistance Bed Mobility: Supine to Sit     Supine to sit: Min assist;Mod assist;HOB elevated     General bed mobility comments: assist for LLE mgt, extra time/effort to perform    Transfers                   General transfer comment: NT, PT in room to assess     Balance Overall balance assessment:  Needs assistance Sitting-balance support: Feet supported;Single extremity supported Sitting balance-Leahy Scale: Fair                                     ADL either performed or assessed with clinical judgement   ADL Overall ADL's : Needs assistance/impaired                                       General ADL Comments: Pt requires MIN-MOD A for LB ADL tasks 2/2 L knee pain and decr ROM/strength     Vision       Perception     Praxis      Cognition Arousal/Alertness: Awake/alert Behavior During Therapy: WFL for tasks assessed/performed;Flat affect Overall Cognitive Status: Within Functional Limits for tasks assessed                                 General Comments: flat, quiet, responds to questions appropriately          Exercises Other Exercises Other Exercises: Pt instructed in bed mobility, active pain assessments to supoprt optimal pain control coordination   Shoulder Instructions       General Comments      Pertinent Vitals/ Pain       Pain Assessment: 0-10 Pain Score: 5  Pain Location: L knee Pain Descriptors / Indicators: Aching;Discomfort;Sore Pain Intervention(s): Limited activity within patient's tolerance;Monitored during session;Premedicated before session;Patient requesting pain meds-RN notified;RN gave pain meds during session  Home Living                                          Prior Functioning/Environment              Frequency  Min 2X/week        Progress Toward Goals  OT Goals(current goals can now be found in the care plan section)  Progress towards OT goals: Progressing toward goals  Acute Rehab OT Goals Patient Stated Goal: to go home OT Goal Formulation: With patient Time For Goal Achievement: 01/15/21 Potential to Achieve Goals: Good  Plan Discharge plan remains appropriate;Frequency remains appropriate    Co-evaluation                 AM-PAC OT  "6 Clicks" Daily Activity     Outcome Measure   Help from another person eating meals?: None Help from another person taking care of personal grooming?: A Little Help from another person toileting, which includes using toliet, bedpan, or urinal?: A Little Help from another person bathing (including washing, rinsing, drying)?: A Little Help from another person to put on and taking off regular upper body clothing?: A Little Help from another person to put on and taking off regular lower body clothing?: A Lot 6 Click Score: 18    End of Session    OT Visit Diagnosis: Other abnormalities of gait and mobility (R26.89);Unsteadiness on feet (R26.81)   Activity Tolerance Patient tolerated treatment well;Patient limited by pain   Patient Left Other (comment) (seated EOB with PT)   Nurse Communication Patient requests pain meds        Time: 0950-1001 OT Time Calculation (min): 11 min  Charges: OT General Charges $OT Visit: 1 Visit OT Treatments $Therapeutic Activity: 8-22 mins  Arman Filter., MPH, MS, OTR/L ascom [phone] 01/02/21, 10:12 AM

## 2021-01-02 NOTE — Progress Notes (Signed)
Late entry: Patient is self sufficient and does not need help with home unit at bedside.

## 2021-01-02 NOTE — Discharge Summary (Signed)
Physician Discharge Summary  [name] MRN: [id number] DOB/AGE: [birth year] 70 y.o.  Admit date: 12/31/2020 Discharge date: 01/02/2021  Admission Diagnoses:  S/P TKR (total knee replacement) using cement, left [Z96.652]   Discharge Diagnoses: Patient Active Problem List   Diagnosis Date Noted   S/P TKR (total knee replacement) using cement, left 12/31/2020    Past Medical History:  Diagnosis Date   Arthritis    Depression    Diabetes mellitus without complication (HCC)    Sleep apnea      Transfusion: none   Consultants (if any):   Discharged Condition: Improved  Hospital Course: Sabrina Arroyo is an 70 y.o. female who was admitted 12/31/2020 with a diagnosis of left knee osteoarthritis and went to the operating room on 12/31/2020 and underwent the above named procedures.    Surgeries: Procedure(s): TOTAL KNEE ARTHROPLASTY on 12/31/2020 Patient tolerated the surgery well. Taken to PACU where she was stabilized and then transferred to the orthopedic floor.  Started on Lovenox 30 mg q 12 hrs. Foot pumps applied bilaterally at 80 mm. Heels elevated on bed with rolled towels. No evidence of DVT. Negative Homan. Physical therapy started on day #1 for gait training and transfer. OT started day #1 for ADL and assisted devices.  Patient's foley was d/c on day #1. Patient's IV  was d/c on day #2.  On post op day #2 patient was stable and ready for discharge to home with HHPT.  Implants: Medacta  GMK sphere system with 3+ left femur, left 3 tibia with short stem and 10 mm insert.  Size 2 patella, all components cemented  She was given perioperative antibiotics:  Anti-infectives (From admission, onward)    Start     Dose/Rate Route Frequency Ordered Stop   12/31/20 1400  ceFAZolin (ANCEF) IVPB 2g/100 mL premix        2 g 200 mL/hr over 30 Minutes Intravenous Every 6 hours 12/31/20 1221 12/31/20 2036   12/31/20 0620  ceFAZolin (ANCEF) 2-4 GM/100ML-% IVPB        Note to Pharmacy: Junius Creamer   : cabinet override      12/31/20 0620 12/31/20 0754   12/31/20 0600  ceFAZolin (ANCEF) IVPB 2g/100 mL premix        2 g 200 mL/hr over 30 Minutes Intravenous On call to O.R. 12/31/20 4627 12/31/20 0820     .  She was given sequential compression devices, early ambulation, and Lovenox TEDs for DVT prophylaxis.  She benefited maximally from the hospital stay and there were no complications.    Recent vital signs:  Vitals:   01/02/21 0601 01/02/21 0814  BP:  (!) 100/51  Pulse: 92 95  Resp:  15  Temp:  98.4 F (36.9 C)  SpO2: 95% 95%    Recent laboratory studies:  Lab Results  Component Value Date   HGB 11.6 (L) 01/01/2021   HGB 12.3 12/31/2020   HGB 13.6 12/19/2020   Lab Results  Component Value Date   WBC 9.2 01/01/2021   PLT 150 01/01/2021   No results found for: INR Lab Results  Component Value Date   NA 132 (L) 01/01/2021   K 4.3 01/01/2021   CL 98 01/01/2021   CO2 25 01/01/2021   BUN 15 01/01/2021   CREATININE 0.88 01/01/2021   GLUCOSE 181 (H) 01/01/2021    Discharge Medications:   Allergies as of 01/02/2021       Reactions   Codeine Itching, Rash  Medication List     TAKE these medications    amoxicillin 500 MG capsule Commonly known as: AMOXIL Take 2,000 mg by mouth once.   amphetamine-dextroamphetamine 20 MG 24 hr capsule Commonly known as: ADDERALL XR Take 20 mg by mouth daily.   ARIPiprazole 5 MG tablet Commonly known as: ABILIFY Take 5 mg by mouth at bedtime.   aspirin 81 MG EC tablet Take 81 mg by mouth daily.   Biotin 00867 MCG Tabs Take 10,000 mcg by mouth daily.   Cholecalciferol 25 MCG (1000 UT) capsule Take 2,000 Units by mouth daily.   docusate sodium 100 MG capsule Commonly known as: COLACE Take 1 capsule (100 mg total) by mouth 2 (two) times daily.   enoxaparin 40 MG/0.4ML injection Commonly known as: LOVENOX Inject 0.4 mLs (40 mg total) into the skin daily for 14 days.    metFORMIN 500 MG 24 hr tablet Commonly known as: GLUCOPHAGE-XR Take 1,000 mg by mouth 2 (two) times daily.   methocarbamol 500 MG tablet Commonly known as: ROBAXIN Take 1 tablet (500 mg total) by mouth every 6 (six) hours as needed for muscle spasms.   multivitamin with minerals tablet Take 1 tablet by mouth daily. Woman   omeprazole 20 MG capsule Commonly known as: PRILOSEC Take 20 mg by mouth at bedtime.   oxyCODONE 5 MG immediate release tablet Commonly known as: Oxy IR/ROXICODONE Take 1-2 tablets (5-10 mg total) by mouth every 4 (four) hours as needed for moderate pain.   polyethylene glycol 17 g packet Commonly known as: MIRALAX / GLYCOLAX Take 17 g by mouth daily as needed for mild constipation.   pramipexole 0.5 MG tablet Commonly known as: MIRAPEX Take 0.5 mg by mouth at bedtime.   rosuvastatin 40 MG tablet Commonly known as: CRESTOR Take 40 mg by mouth at bedtime.   traMADol 50 MG tablet Commonly known as: ULTRAM Take 1 tablet (50 mg total) by mouth every 6 (six) hours as needed.   vitamin C 500 MG tablet Commonly known as: ASCORBIC ACID Take 500 mg by mouth daily.   vortioxetine HBr 20 MG Tabs tablet Commonly known as: TRINTELLIX Take 20 mg by mouth at bedtime.               Durable Medical Equipment  (From admission, onward)           Start     Ordered   12/31/20 1222  DME Walker rolling  Once       Question Answer Comment  Walker: With 5 Inch Wheels   Patient needs a walker to treat with the following condition S/P TKR (total knee replacement) using cement, left      12/31/20 1221   12/31/20 1222  DME 3 n 1  Once        12/31/20 1221   12/31/20 1222  DME Bedside commode  Once       Question:  Patient needs a bedside commode to treat with the following condition  Answer:  S/P TKR (total knee replacement) using cement, left   12/31/20 1221            Diagnostic Studies: DG Knee 1-2 Views Left  Result Date: 12/31/2020 CLINICAL  DATA:  Postop EXAM: LEFT KNEE - 1-2 VIEW COMPARISON:  None FINDINGS: There is a 3 component total knee arthroplasty in normal alignment without evidence of loosening or periprosthetic fracture. Expected soft tissue changes. Proximal tibiofibular joint degenerative change. IMPRESSION: Left total knee arthroplasty without evidence of  immediate hardware complication. Electronically Signed   By: Caprice Renshaw M.D.   On: 12/31/2020 10:29    Disposition:      Follow-up Information     Evon Slack, PA-C Follow up in 2 week(s).   Specialties: Orthopedic Surgery, Emergency Medicine Contact information: [address] [place] [address] [phone]                  Signed: Patience Musca 01/02/2021, 9:22 AM

## 2021-02-07 ENCOUNTER — Encounter: Payer: Self-pay | Admitting: Emergency Medicine

## 2021-02-07 ENCOUNTER — Emergency Department: Payer: Medicare Other

## 2021-02-07 ENCOUNTER — Other Ambulatory Visit: Payer: Self-pay

## 2021-02-07 ENCOUNTER — Emergency Department
Admission: EM | Admit: 2021-02-07 | Discharge: 2021-02-07 | Disposition: A | Discharge status: Home / Self Care | Payer: Medicare Other | Attending: Emergency Medicine | Admitting: Emergency Medicine

## 2021-02-07 DIAGNOSIS — Z96653 Presence of artificial knee joint, bilateral: Secondary | ICD-10-CM | POA: Insufficient documentation

## 2021-02-07 DIAGNOSIS — E119 Type 2 diabetes mellitus without complications: Secondary | ICD-10-CM | POA: Diagnosis not present

## 2021-02-07 DIAGNOSIS — K2289 Other specified disease of esophagus: Secondary | ICD-10-CM | POA: Insufficient documentation

## 2021-02-07 DIAGNOSIS — E876 Hypokalemia: Secondary | ICD-10-CM | POA: Diagnosis not present

## 2021-02-07 DIAGNOSIS — Z7984 Long term (current) use of oral hypoglycemic drugs: Secondary | ICD-10-CM | POA: Insufficient documentation

## 2021-02-07 DIAGNOSIS — R634 Abnormal weight loss: Secondary | ICD-10-CM | POA: Diagnosis not present

## 2021-02-07 DIAGNOSIS — Z7982 Long term (current) use of aspirin: Secondary | ICD-10-CM | POA: Insufficient documentation

## 2021-02-07 DIAGNOSIS — K229 Disease of esophagus, unspecified: Secondary | ICD-10-CM | POA: Insufficient documentation

## 2021-02-07 DIAGNOSIS — R799 Abnormal finding of blood chemistry, unspecified: Secondary | ICD-10-CM | POA: Diagnosis present

## 2021-02-07 LAB — COMPREHENSIVE METABOLIC PANEL
ALT: 23 U/L (ref 0–44)
AST: 31 U/L (ref 15–41)
Albumin: 3.9 g/dL (ref 3.5–5.0)
Alkaline Phosphatase: 72 U/L (ref 38–126)
Anion gap: 8 (ref 5–15)
BUN: 15 mg/dL (ref 8–23)
CO2: 26 mmol/L (ref 22–32)
Calcium: 10 mg/dL (ref 8.9–10.3)
Chloride: 103 mmol/L (ref 98–111)
Creatinine, Ser: 1.56 mg/dL — ABNORMAL HIGH (ref 0.44–1.00)
GFR, Estimated: 35 mL/min — ABNORMAL LOW (ref >60)
Glucose, Bld: 190 mg/dL — ABNORMAL HIGH (ref 70–99)
Potassium: 2.8 mmol/L — ABNORMAL LOW (ref 3.5–5.1)
Sodium: 137 mmol/L (ref 135–145)
Total Bilirubin: 0.8 mg/dL (ref 0.3–1.2)
Total Protein: 7.5 g/dL (ref 6.5–8.1)

## 2021-02-07 LAB — CBC
HCT: 42 % (ref 36.0–46.0)
Hemoglobin: 13.8 g/dL (ref 12.0–15.0)
MCH: 27 pg (ref 26.0–34.0)
MCHC: 32.9 g/dL (ref 30.0–36.0)
MCV: 82.2 fL (ref 80.0–100.0)
Platelets: 278 10*3/uL (ref 150–400)
RBC: 5.11 MIL/uL (ref 3.87–5.11)
RDW: 14.4 % (ref 11.5–15.5)
WBC: 6.4 10*3/uL (ref 4.0–10.5)
nRBC: 0 % (ref 0.0–0.2)

## 2021-02-07 LAB — LIPASE, BLOOD: Lipase: 65 U/L — ABNORMAL HIGH (ref 11–51)

## 2021-02-07 MED ORDER — POTASSIUM CHLORIDE CRYS ER 20 MEQ PO TBCR: 20.0000 meq | EXTENDED_RELEASE_TABLET | Freq: Two times a day (BID) | ORAL | 0 refills | Status: DC

## 2021-02-07 MED ORDER — IOHEXOL 300 MG/ML  SOLN
80.0000 mL | Freq: Once | INTRAMUSCULAR | Status: AC | PRN
  Administered 2021-02-07: 80 mL via INTRAVENOUS

## 2021-02-07 NOTE — ED Triage Notes (Signed)
Pt comes into the ED via POV c/o abnormal kidney function labs.  Pt states that she has been sick with N/V for about a week and had labs checked yesterday and the MD called her this morning and informed her to come be evaluated.  Pt denies any current symptoms at this time.  PT ambulatory to triage with even and unlabored respirations.

## 2021-02-07 NOTE — Discharge Instructions (Addendum)
Please schedule an appointment with GI because your CT scan shows esophageal thickening which will likely need an endoscopy to evaluate further

## 2021-02-07 NOTE — ED Provider Notes (Signed)
[facility] Emergency Department Provider Note   ____________________________________________    I have reviewed the triage vital signs and the nursing notes.   HISTORY  Chief Complaint Abnormal Lab     HPI Sabrina Arroyo is a 70 y.o. female with history of diabetes who was sent to the emergency department for evaluation of abnormal lab value.  Patient reports her kidney tests were abnormal per her PCP.  She reports overall no new complaints today however she has had decreased appetite with mild nausea and significant weight loss over the last month since having a total knee replacement.  No fevers or chills.  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 Authorizing Provider  potassium chloride SA (KLOR-CON M) 20 MEQ tablet Take 1 tablet (20 mEq total) by mouth 2 (two) times daily. 02/07/21  Yes Jene Every, MD  amoxicillin (AMOXIL) 500 MG capsule Take 2,000 mg by mouth once. 11/15/20 11/15/21  [provider]  amphetamine-dextroamphetamine (ADDERALL XR) 20 MG 24 hr capsule Take 20 mg by mouth daily. 10/25/19   [provider]  ARIPiprazole (ABILIFY) 5 MG tablet Take 5 mg by mouth at bedtime. 11/17/20   [provider]  aspirin 81 MG EC tablet Take 81 mg by mouth daily. 11/11/16   [provider]  Biotin 89373 MCG TABS Take 10,000 mcg by mouth daily.    [provider]  Cholecalciferol 25 MCG (1000 UT) capsule Take 2,000 Units by mouth daily. 10/17/15   [provider]  docusate sodium (COLACE) 100 MG capsule Take 1 capsule (100 mg total) by mouth 2 (two) times daily. 01/02/21   Evon Slack, PA-C  enoxaparin (LOVENOX) 40 MG/0.4ML injection Inject 0.4 mLs (40 mg total) into the skin daily for 14 days. 01/02/21 01/16/21  Evon Slack, PA-C  metFORMIN (GLUCOPHAGE-XR) 500 MG 24 hr tablet Take 1,000 mg by mouth 2 (two) times daily. 11/27/20   [provider]  methocarbamol (ROBAXIN) 500 MG tablet Take 1 tablet (500 mg total) by mouth every 6 (six) hours as needed for muscle spasms. 01/02/21   Evon Slack, PA-C  Multiple Vitamins-Minerals (MULTIVITAMIN WITH MINERALS) tablet Take 1 tablet by mouth daily. Woman    [provider]  omeprazole (PRILOSEC) 20 MG capsule Take 20 mg by mouth at bedtime. 11/05/20   [provider]  oxyCODONE (OXY IR/ROXICODONE) 5 MG immediate release tablet Take 1-2 tablets (5-10 mg total) by mouth every 4 (four) hours as needed for moderate pain. 01/02/21   Evon Slack, PA-C  polyethylene glycol (MIRALAX / GLYCOLAX) 17 g packet Take 17 g by mouth daily as needed for mild constipation. 01/02/21   Evon Slack, PA-C  pramipexole (MIRAPEX) 0.5 MG tablet Take  0.5 mg by mouth at bedtime. 07/18/20   [provider]  rosuvastatin (CRESTOR) 40 MG tablet Take 40 mg by mouth at bedtime. 11/27/20   [provider]  traMADol (ULTRAM) 50 MG tablet Take 1 tablet (50 mg total) by mouth every 6 (six) hours as needed. 01/02/21   Evon Slack, PA-C  vitamin C (ASCORBIC ACID) 500 MG tablet Take 500 mg by mouth daily.    [provider]  vortioxetine HBr (TRINTELLIX) 20 MG TABS tablet Take 20 mg by mouth at bedtime. 12/03/17   [provider]     Allergies Codeine  History  reviewed. No pertinent family history.  Social History Social History   Tobacco Use   Smoking status: Never   Smokeless tobacco: Never  Substance Use Topics   Alcohol use: Not Currently   Drug use: Never    Review of Systems  Constitutional: No fever/chills Eyes: No visual changes.  ENT: No sore throat. Cardiovascular: Denies chest pain. Respiratory: Denies shortness of breath. Gastrointestinal: No abdominal pain, as above Genitourinary: Negative for dysuria. Musculoskeletal: Negative for back pain. Skin: Negative for rash. Neurological: Negative for headaches or weakness   ____________________________________________   PHYSICAL EXAM:  VITAL SIGNS: ED Triage Vitals  Enc Vitals Group     BP 02/07/21 1115 121/78     Pulse Rate 02/07/21 1115 (!) 107     Resp 02/07/21 1115 17     Temp 02/07/21 1115 98.2 F (36.8 C)     Temp Source 02/07/21 1115 Oral     SpO2 02/07/21 1115 94 %     Weight 02/07/21 1049 93.1 kg (205 lb 4 oz)     Height 02/07/21 1049 1.676 m (5\' 6" )     Head Circumference --      Peak Flow --      Pain Score 02/07/21 1049 0     Pain Loc --      Pain Edu? --      Excl. in GC? --     Constitutional: Alert and oriented. No acute distress.   Nose: No congestion/rhinnorhea. Mouth/Throat: Mucous membranes are moist.    Cardiovascular: Normal rate, regular rhythm. Grossly normal heart sounds.  Good peripheral circulation. Respiratory: Normal respiratory effort.  No retractions. Lungs CTAB. Gastrointestinal: Soft and nontender. No distention.    Musculoskeletal: No lower extremity tenderness nor edema.  Warm and well perfused Neurologic:  Normal speech and language. No gross focal neurologic deficits are appreciated.  Skin:  Skin is warm, dry and intact. No rash noted. Psychiatric: Mood and affect are normal. Speech and behavior are normal.  ____________________________________________   LABS (all labs ordered are listed, but only abnormal  results are displayed)  Labs Reviewed  LIPASE, BLOOD - Abnormal; Notable for the following components:      Result Value   Lipase 65 (*)    All other components within normal limits  COMPREHENSIVE METABOLIC PANEL - Abnormal; Notable for the following components:   Potassium 2.8 (*)    Glucose, Bld 190 (*)    Creatinine, Ser 1.56 (*)    GFR, Estimated 35 (*)    All other components within normal limits  CBC  URINALYSIS, ROUTINE W REFLEX MICROSCOPIC   ____________________________________________  EKG ED ECG REPORT I, 14/09/22, the attending physician, personally viewed and interpreted this ECG.  Date: 02/07/2021  Rhythm: normal sinus rhythm QRS Axis: normal Intervals: normal ST/T Wave abnormalities: normal Narrative Interpretation: no evidence of acute ischemia  ____________________________________________  RADIOLOGY  CT chest abdomen and pelvis reviewed by me, no acute findings noted, pending radiology review  Radiology notes esophageal thickening particularly distally ____________________________________________   PROCEDURES  Procedure(s) performed: No  Procedures   Critical Care performed: No ____________________________________________   INITIAL IMPRESSION / ASSESSMENT AND PLAN / ED COURSE  Pertinent labs & imaging results that were available during my care of the patient were reviewed by me and considered in my medical decision making (see chart for details).   Patient presents for reevaluation of lab work.  Reviewed medical records, her creatinine was 1.7 yesterday with mild hyperkalemia  Today her potassium is 2.8 and her creatinine is 1.56, normal BUN  More concerning is her description of over 20 pounds of weight loss in the last month for unclear reasons.  Discussed with patient and opted to send her for CT chest abdomen pelvis  Esophageal thickening noted, this could be a cause of her weight loss/nausea.  Will refer to GI for  endoscopy/further evaluation.  Kdur sent to her pharmacy for potassium repletion    ____________________________________________   FINAL CLINICAL IMPRESSION(S) / ED DIAGNOSES  Final diagnoses:  Hypokalemia  Esophageal thickening        Note:  This document was prepared using Dragon voice recognition software and may include unintentional dictation errors.    Jene Every, MD 02/07/21 [phone]

## 2021-02-07 NOTE — ED Notes (Signed)
Patient transported to CT 

## 2021-03-18 ENCOUNTER — Emergency Department: Payer: Medicare Other

## 2021-03-18 ENCOUNTER — Encounter: Payer: Self-pay | Admitting: Emergency Medicine

## 2021-03-18 ENCOUNTER — Emergency Department
Admission: EM | Admit: 2021-03-18 | Discharge: 2021-03-19 | Disposition: A | Discharge status: Home / Self Care | Payer: Medicare Other | Attending: Emergency Medicine | Admitting: Emergency Medicine

## 2021-03-18 ENCOUNTER — Other Ambulatory Visit: Payer: Self-pay

## 2021-03-18 DIAGNOSIS — K8050 Calculus of bile duct without cholangitis or cholecystitis without obstruction: Secondary | ICD-10-CM

## 2021-03-18 DIAGNOSIS — R1013 Epigastric pain: Secondary | ICD-10-CM | POA: Insufficient documentation

## 2021-03-18 DIAGNOSIS — R1011 Right upper quadrant pain: Secondary | ICD-10-CM | POA: Insufficient documentation

## 2021-03-18 DIAGNOSIS — E119 Type 2 diabetes mellitus without complications: Secondary | ICD-10-CM | POA: Diagnosis not present

## 2021-03-18 LAB — CBC
HCT: 40 % (ref 36.0–46.0)
Hemoglobin: 12.8 g/dL (ref 12.0–15.0)
MCH: 27.3 pg (ref 26.0–34.0)
MCHC: 32 g/dL (ref 30.0–36.0)
MCV: 85.3 fL (ref 80.0–100.0)
Platelets: 228 10*3/uL (ref 150–400)
RBC: 4.69 MIL/uL (ref 3.87–5.11)
RDW: 14.4 % (ref 11.5–15.5)
WBC: 10.1 10*3/uL (ref 4.0–10.5)
nRBC: 0 % (ref 0.0–0.2)

## 2021-03-18 LAB — COMPREHENSIVE METABOLIC PANEL
ALT: 27 U/L (ref 0–44)
AST: 28 U/L (ref 15–41)
Albumin: 4 g/dL (ref 3.5–5.0)
Alkaline Phosphatase: 70 U/L (ref 38–126)
Anion gap: 12 (ref 5–15)
BUN: 21 mg/dL (ref 8–23)
CO2: 25 mmol/L (ref 22–32)
Calcium: 9.8 mg/dL (ref 8.9–10.3)
Chloride: 100 mmol/L (ref 98–111)
Creatinine, Ser: 0.74 mg/dL (ref 0.44–1.00)
GFR, Estimated: >60 mL/min (ref >60)
Glucose, Bld: 185 mg/dL — ABNORMAL HIGH (ref 70–99)
Potassium: 4 mmol/L (ref 3.5–5.1)
Sodium: 137 mmol/L (ref 135–145)
Total Bilirubin: 0.8 mg/dL (ref 0.3–1.2)
Total Protein: 7.6 g/dL (ref 6.5–8.1)

## 2021-03-18 LAB — LIPASE, BLOOD: Lipase: 55 U/L — ABNORMAL HIGH (ref 11–51)

## 2021-03-18 LAB — TROPONIN I (HIGH SENSITIVITY): Troponin I (High Sensitivity): 4 ng/L (ref <18)

## 2021-03-18 MED ORDER — FAMOTIDINE 20 MG PO TABS
20.0000 mg | ORAL_TABLET | Freq: Once | ORAL | Status: AC
  Administered 2021-03-19: 20 mg via ORAL
  Filled 2021-03-18: qty 1

## 2021-03-18 MED ORDER — ONDANSETRON 4 MG PO TBDP
4.0000 mg | ORAL_TABLET | Freq: Once | ORAL | Status: AC
  Administered 2021-03-19: 4 mg via ORAL
  Filled 2021-03-18: qty 1

## 2021-03-18 MED ORDER — ALUM & MAG HYDROXIDE-SIMETH 200-200-20 MG/5ML PO SUSP
30.0000 mL | Freq: Once | ORAL | Status: AC
  Administered 2021-03-19: 30 mL via ORAL
  Filled 2021-03-18: qty 30

## 2021-03-18 MED ORDER — IOHEXOL 300 MG/ML  SOLN
100.0000 mL | Freq: Once | INTRAMUSCULAR | Status: AC | PRN
  Administered 2021-03-18: 100 mL via INTRAVENOUS

## 2021-03-18 NOTE — ED Triage Notes (Signed)
Pt to ED from home c/o epigastric pain about 10 min PTA that feels like pressure, denies SOB, states vomited x1 that was "foamy" like.  Pt A&Ox4, chest rise even and unlabored, skin WNL and in NAD at this time.

## 2021-03-18 NOTE — ED Notes (Signed)
ED Provider at bedside. 

## 2021-03-18 NOTE — ED Provider Notes (Addendum)
[facility] Provider Note    Event Date/Time   First MD Initiated Contact with Patient 03/18/21 2235     (approximate)   History   Abdominal Pain   HPI  Sabrina Arroyo is a 71 y.o. female past medical history of diabetes, depression, arthritis who presents with abdominal pain.  Symptoms started around 4 PM.  Patient describes pain that is relatively constant and located in the epigastrium and right upper quadrant.  Has had 2 episodes of emesis.  Denies diarrhea or constipation.  No fevers or chills.  Patient was seen here on 12/9 for abdominal pain, had a CT abdomen pelvis that showed some potential inflammation of her esophagus.  At that time there was also noted to be gallstones but no evidence of cholecystitis.  Patient tells me that she has had an EGD with GI since but she was not told there was any abnormality.    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 --      Excl. in GC? --     Most recent vital signs: Vitals:   03/18/21 2004  BP: (!) 157/88  Pulse: 64  Temp: 98.8 F (37.1 C)  SpO2: 97%     General: Awake, no distress.  CV:  Good peripheral perfusion.  Resp:  Normal effort.  Abd:  No distention.  Mild tenderness to palpation epigastric region, right upper quadrant and right  lower quadrant Neuro:             Awake, Alert, Oriented x 3  Other:     ED Results / Procedures / Treatments  Labs (all labs ordered are listed, but only abnormal results are displayed) Labs Reviewed  LIPASE, BLOOD - Abnormal; Notable for the following components:      Result Value   Lipase 55 (*)    All other components within normal limits  COMPREHENSIVE METABOLIC PANEL - Abnormal; Notable for the following components:   Glucose, Bld 185 (*)    All other components within normal limits  CBC  URINALYSIS, ROUTINE W REFLEX MICROSCOPIC  TROPONIN I (HIGH SENSITIVITY)  TROPONIN I (HIGH SENSITIVITY)     EKG  ED interpreted by myself, normal sinus rhythm, normal axis normal intervals no acute ischemic changes   RADIOLOGY Right upper quadrant ultrasound performed by myself shows gallbladder with normal gallbladder wall, few stones, no pericholecystic fluid, normal-appearing CBD   PROCEDURES:  Critical Care performed: No  Procedures  The patient is on  the cardiac monitor to evaluate for evidence of arrhythmia and/or significant heart rate changes.   MEDICATIONS ORDERED IN ED: Medications  famotidine (PEPCID) tablet 20 mg (has no administration in time range)  alum & mag hydroxide-simeth (MAALOX/MYLANTA) 200-200-20 MG/5ML suspension 30 mL (has no administration in time range)  ondansetron (ZOFRAN-ODT) disintegrating tablet 4 mg (has no administration in time range)  iohexol (OMNIPAQUE) 300 MG/ML solution 100 mL (100 mLs Intravenous Contrast Given 03/18/21 2335)     IMPRESSION / MDM / ASSESSMENT AND PLAN / ED COURSE  I reviewed the triage vital signs and the nursing notes.                              Differential diagnosis includes, but is not limited to, cholecystitis, biliary colic, gastritis, peptic ulcer disease, appendicitis, pancreatitis  71 year old female presents with abdominal pain with head describes pain primarily located in epigastric region.  Has been  rather constant since onset.  Vital signs within normal limits.  Patient appears comfortable on exam.  She is tender both in the right upper and right lower quadrant.  I performed a bedside ultrasound to evaluate for signs of cholecystitis and while there are some stones there were no other signs of cholecystitis, CBD appears to be normal in size.  His blood work overall reassuring, normal lipase and LFTs, troponin is negative.  Reviewed her EKG which does not have any ischemic signs.  Given her age and the possible tenderness in the right lower quadrant will obtain a CT abdomen pelvis.   CT A/P largely unremarkable.  Gallstones but e/o cholecystitis. Question whether her pain is 2/2 biliary colic vs. gastritis/reflux type pain. At the time of re-evaluation patient is feeling much better. She received pepcid and a GI cocktail. She is tolerating PO. She is appropriate for discharge.. Will refer to general surgery to discuss cholecystomy.    FINAL CLINICAL IMPRESSION(S) / ED DIAGNOSES   Final diagnoses:  Epigastric abdominal pain     Rx / DC Orders   ED Discharge Orders     None        Note:  This document was prepared using Dragon voice recognition software and may include unintentional dictation errors.   Georga Hacking, MD 03/18/21 4970    Georga Hacking, MD 03/19/21 1816

## 2021-03-19 LAB — TROPONIN I (HIGH SENSITIVITY): Troponin I (High Sensitivity): 4 ng/L (ref <18)

## 2021-03-19 MED ORDER — ONDANSETRON HCL 4 MG PO TABS: 4.0000 mg | ORAL_TABLET | Freq: Every day | ORAL | 1 refills | Status: DC | PRN

## 2021-03-19 NOTE — ED Notes (Signed)
ED Provider at bedside. 

## 2021-03-23 ENCOUNTER — Inpatient Hospital Stay (HOSPITAL_COMMUNITY)
Admission: EM | Admit: 2021-03-23 | Discharge: 2021-03-28 | DRG: 418 | Disposition: A | Discharge status: Home / Self Care | Payer: Medicare Other | Attending: Internal Medicine | Admitting: Internal Medicine

## 2021-03-23 ENCOUNTER — Other Ambulatory Visit: Payer: Self-pay

## 2021-03-23 ENCOUNTER — Emergency Department (HOSPITAL_COMMUNITY): Payer: Medicare Other

## 2021-03-23 ENCOUNTER — Encounter (HOSPITAL_COMMUNITY): Payer: Self-pay

## 2021-03-23 DIAGNOSIS — M1990 Unspecified osteoarthritis, unspecified site: Secondary | ICD-10-CM | POA: Diagnosis present

## 2021-03-23 DIAGNOSIS — K8020 Calculus of gallbladder without cholecystitis without obstruction: Secondary | ICD-10-CM | POA: Diagnosis not present

## 2021-03-23 DIAGNOSIS — R933 Abnormal findings on diagnostic imaging of other parts of digestive tract: Secondary | ICD-10-CM

## 2021-03-23 DIAGNOSIS — K8071 Calculus of gallbladder and bile duct without cholecystitis with obstruction: Secondary | ICD-10-CM | POA: Diagnosis present

## 2021-03-23 DIAGNOSIS — Z7982 Long term (current) use of aspirin: Secondary | ICD-10-CM

## 2021-03-23 DIAGNOSIS — Z6831 Body mass index (BMI) 31.0-31.9, adult: Secondary | ICD-10-CM

## 2021-03-23 DIAGNOSIS — E119 Type 2 diabetes mellitus without complications: Secondary | ICD-10-CM | POA: Diagnosis present

## 2021-03-23 DIAGNOSIS — E440 Moderate protein-calorie malnutrition: Secondary | ICD-10-CM | POA: Diagnosis present

## 2021-03-23 DIAGNOSIS — K229 Disease of esophagus, unspecified: Secondary | ICD-10-CM | POA: Diagnosis present

## 2021-03-23 DIAGNOSIS — Z7984 Long term (current) use of oral hypoglycemic drugs: Secondary | ICD-10-CM

## 2021-03-23 DIAGNOSIS — Q453 Other congenital malformations of pancreas and pancreatic duct: Secondary | ICD-10-CM

## 2021-03-23 DIAGNOSIS — Z885 Allergy status to narcotic agent status: Secondary | ICD-10-CM

## 2021-03-23 DIAGNOSIS — Z20822 Contact with and (suspected) exposure to covid-19: Secondary | ICD-10-CM | POA: Diagnosis present

## 2021-03-23 DIAGNOSIS — K851 Biliary acute pancreatitis without necrosis or infection: Principal | ICD-10-CM | POA: Diagnosis present

## 2021-03-23 DIAGNOSIS — E1169 Type 2 diabetes mellitus with other specified complication: Secondary | ICD-10-CM | POA: Diagnosis present

## 2021-03-23 DIAGNOSIS — Z800 Family history of malignant neoplasm of digestive organs: Secondary | ICD-10-CM

## 2021-03-23 DIAGNOSIS — K8050 Calculus of bile duct without cholangitis or cholecystitis without obstruction: Secondary | ICD-10-CM

## 2021-03-23 DIAGNOSIS — F32A Depression, unspecified: Secondary | ICD-10-CM | POA: Diagnosis present

## 2021-03-23 DIAGNOSIS — G4733 Obstructive sleep apnea (adult) (pediatric): Secondary | ICD-10-CM | POA: Diagnosis present

## 2021-03-23 DIAGNOSIS — Z96652 Presence of left artificial knee joint: Secondary | ICD-10-CM | POA: Diagnosis present

## 2021-03-23 DIAGNOSIS — E669 Obesity, unspecified: Secondary | ICD-10-CM | POA: Diagnosis present

## 2021-03-23 DIAGNOSIS — K5900 Constipation, unspecified: Secondary | ICD-10-CM | POA: Diagnosis present

## 2021-03-23 DIAGNOSIS — R109 Unspecified abdominal pain: Secondary | ICD-10-CM | POA: Diagnosis present

## 2021-03-23 DIAGNOSIS — E1165 Type 2 diabetes mellitus with hyperglycemia: Secondary | ICD-10-CM | POA: Diagnosis present

## 2021-03-23 DIAGNOSIS — E785 Hyperlipidemia, unspecified: Secondary | ICD-10-CM | POA: Diagnosis present

## 2021-03-23 DIAGNOSIS — Z79899 Other long term (current) drug therapy: Secondary | ICD-10-CM

## 2021-03-23 DIAGNOSIS — E876 Hypokalemia: Secondary | ICD-10-CM | POA: Diagnosis present

## 2021-03-23 DIAGNOSIS — K831 Obstruction of bile duct: Secondary | ICD-10-CM

## 2021-03-23 DIAGNOSIS — R7989 Other specified abnormal findings of blood chemistry: Secondary | ICD-10-CM

## 2021-03-23 LAB — COMPREHENSIVE METABOLIC PANEL
ALT: 148 U/L — ABNORMAL HIGH (ref 0–44)
AST: 105 U/L — ABNORMAL HIGH (ref 15–41)
Albumin: 4.1 g/dL (ref 3.5–5.0)
Alkaline Phosphatase: 299 U/L — ABNORMAL HIGH (ref 38–126)
Anion gap: 13 (ref 5–15)
BUN: 16 mg/dL (ref 8–23)
CO2: 26 mmol/L (ref 22–32)
Calcium: 9.7 mg/dL (ref 8.9–10.3)
Chloride: 98 mmol/L (ref 98–111)
Creatinine, Ser: 0.74 mg/dL (ref 0.44–1.00)
GFR, Estimated: >60 mL/min (ref >60)
Glucose, Bld: 196 mg/dL — ABNORMAL HIGH (ref 70–99)
Potassium: 3.2 mmol/L — ABNORMAL LOW (ref 3.5–5.1)
Sodium: 137 mmol/L (ref 135–145)
Total Bilirubin: 3.6 mg/dL — ABNORMAL HIGH (ref 0.3–1.2)
Total Protein: 8.2 g/dL — ABNORMAL HIGH (ref 6.5–8.1)

## 2021-03-23 LAB — CBC
HCT: 42.9 % (ref 36.0–46.0)
Hemoglobin: 13.7 g/dL (ref 12.0–15.0)
MCH: 26.9 pg (ref 26.0–34.0)
MCHC: 31.9 g/dL (ref 30.0–36.0)
MCV: 84.1 fL (ref 80.0–100.0)
Platelets: 241 10*3/uL (ref 150–400)
RBC: 5.1 MIL/uL (ref 3.87–5.11)
RDW: 14.4 % (ref 11.5–15.5)
WBC: 9.4 10*3/uL (ref 4.0–10.5)
nRBC: 0 % (ref 0.0–0.2)

## 2021-03-23 MED ORDER — ALUM & MAG HYDROXIDE-SIMETH 200-200-20 MG/5ML PO SUSP
30.0000 mL | Freq: Once | ORAL | Status: AC
  Administered 2021-03-23: 30 mL via ORAL
  Filled 2021-03-23: qty 30

## 2021-03-23 MED ORDER — ONDANSETRON HCL 4 MG/2ML IJ SOLN
4.0000 mg | Freq: Once | INTRAMUSCULAR | Status: AC
  Administered 2021-03-23: 4 mg via INTRAVENOUS
  Filled 2021-03-23: qty 2

## 2021-03-23 MED ORDER — FENTANYL CITRATE PF 50 MCG/ML IJ SOSY
50.0000 ug | PREFILLED_SYRINGE | Freq: Once | INTRAMUSCULAR | Status: AC
  Administered 2021-03-23: 50 ug via INTRAVENOUS
  Filled 2021-03-23: qty 1

## 2021-03-23 MED ORDER — LACTATED RINGERS IV BOLUS
1000.0000 mL | Freq: Once | INTRAVENOUS | Status: AC
  Administered 2021-03-23: 1000 mL via INTRAVENOUS

## 2021-03-23 NOTE — ED Triage Notes (Signed)
Pt comes via [facility] from home for abd pain for the past week, that got worse today, upper central abd pain. Seen at [facility] for the same, told it was gallstones and has appt tomorrow

## 2021-03-23 NOTE — ED Provider Notes (Signed)
[facility] EMERGENCY DEPARTMENT Provider Note    CSN: [id number] Arrival date & time: 03/23/21 2021  History Chief Complaint  Patient presents with   Abdominal Pain    Sabrina Arroyo is a 71 y.o. female brought by EMS for epigastric and RUQ pain onset around 3pm today. Constant since then, associated with vomiting but no diarrhea. No fevers. She was seen in the [facility] ED for similar about a week ago found to have gall stones but otherwise negative workup, pain was controlled and she was discharged with referral to Gen Surg who she is scheduled to see tomorrow.    Home Medications Prior to Admission medications   Medication Sig Start Date End Date Taking? Authorizing Provider  amoxicillin (AMOXIL) 500 MG capsule Take 2,000 mg by mouth once. 11/15/20 11/15/21  [provider]  amphetamine-dextroamphetamine (ADDERALL XR) 20 MG 24 hr capsule Take 20 mg by mouth daily. 10/25/19   [provider]  ARIPiprazole (ABILIFY) 5 MG tablet Take 5 mg by mouth at bedtime. 11/17/20   [provider]  aspirin 81 MG EC tablet Take 81 mg by mouth daily. 11/11/16   [provider]  Biotin 25956 MCG TABS Take 10,000 mcg by mouth daily.    [provider]  Cholecalciferol 25 MCG (1000 UT) capsule Take 2,000 Units by mouth daily. 10/17/15   [provider]  docusate sodium (COLACE) 100 MG capsule Take 1 capsule (100 mg total) by mouth 2 (two) times daily. 01/02/21   Evon Slack, PA-C  enoxaparin (LOVENOX) 40 MG/0.4ML injection Inject 0.4 mLs (40 mg total) into the skin daily for 14 days. 01/02/21 01/16/21  Evon Slack, PA-C  metFORMIN (GLUCOPHAGE-XR) 500 MG 24 hr tablet Take 1,000 mg by mouth 2 (two) times daily. 11/27/20   [provider]  methocarbamol (ROBAXIN) 500 MG tablet Take 1 tablet (500 mg total) by mouth every 6 (six) hours as needed for muscle spasms. 01/02/21   Evon Slack, PA-C  Multiple Vitamins-Minerals (MULTIVITAMIN WITH  MINERALS) tablet Take 1 tablet by mouth daily. Woman    [provider]  omeprazole (PRILOSEC) 20 MG capsule Take 20 mg by mouth at bedtime. 11/05/20   [provider]  ondansetron (ZOFRAN) 4 MG tablet Take 1 tablet (4 mg total) by mouth daily as needed for nausea or vomiting. 03/19/21 03/19/22  Georga Hacking, MD  oxyCODONE (OXY IR/ROXICODONE) 5 MG immediate release tablet Take 1-2 tablets (5-10 mg total) by mouth every 4 (four) hours as needed for moderate pain. 01/02/21   Evon Slack, PA-C  polyethylene glycol (MIRALAX / GLYCOLAX) 17 g packet Take 17 g by mouth daily as needed for mild constipation. 01/02/21   Evon Slack, PA-C  potassium chloride SA (KLOR-CON M) 20 MEQ tablet Take 1 tablet (20 mEq total) by mouth 2 (two) times daily. 02/07/21   Jene Every, MD  pramipexole (MIRAPEX) 0.5 MG tablet Take 0.5 mg by mouth at bedtime. 07/18/20   [provider]  rosuvastatin (CRESTOR) 40 MG tablet Take 40 mg by mouth at bedtime. 11/27/20   [provider]  traMADol (ULTRAM) 50 MG tablet Take 1 tablet (50 mg total) by mouth every 6 (six) hours as needed. 01/02/21   Evon Slack, PA-C  vitamin C (ASCORBIC ACID) 500 MG tablet Take 500 mg by mouth daily.    [provider]  vortioxetine HBr (TRINTELLIX) 20 MG TABS tablet Take 20 mg by mouth at bedtime. 12/03/17   [provider]     Allergies    Codeine   Review of Systems   Review of Systems Please see HPI for pertinent positives and negatives  Physical Exam BP (!) 164/76    Pulse (!) 59    Temp 99.4 F (37.4 C) (Oral)    Resp 19    SpO2 97%   Physical Exam Vitals and nursing note reviewed.  Constitutional:      Appearance: Normal appearance.  HENT:     Head: Normocephalic and atraumatic.     Nose: Nose normal.     Mouth/Throat:     Mouth: Mucous membranes are moist.  Eyes:     Extraocular Movements: Extraocular movements intact.     Conjunctiva/sclera: Conjunctivae  normal.  Cardiovascular:     Rate and Rhythm: Normal rate.  Pulmonary:     Effort: Pulmonary effort is normal.     Breath sounds: Normal breath sounds.  Abdominal:     General: Abdomen is flat.     Palpations: Abdomen is soft.     Tenderness: There is abdominal tenderness in the right upper quadrant. There is guarding. There is no rebound. Positive signs include Murphy's sign. Negative signs include McBurney's sign.  Musculoskeletal:        General: No swelling. Normal range of motion.     Cervical back: Neck supple.  Skin:    General: Skin is warm and dry.  Neurological:     General: No focal deficit present.     Mental Status: She is alert.  Psychiatric:        Mood and Affect: Mood normal.    ED Results / Procedures / Treatments   EKG None  Procedures Procedures  Medications Ordered in the ED Medications  fentaNYL (SUBLIMAZE) injection 50 mcg (50 mcg Intravenous Given 03/23/21 2154)  ondansetron (ZOFRAN) injection 4 mg (4 mg Intravenous Given 03/23/21 2153)  lactated ringers bolus 1,000 mL (1,000 mLs Intravenous New Bag/Given 03/23/21 2152)  alum & mag hydroxide-simeth (MAALOX/MYLANTA) 200-200-20 MG/5ML suspension 30 mL (30 mLs Oral Given 03/23/21 2155)    Initial Impression and Plan  Patient with known gall stones, here for RUQ pain, has also had GERD in the past, improved with GI cocktail. Will check labs, [place] and give pain/nausea meds for comfort.   ED Course   Clinical Course as of 03/23/21 2306  Wynelle Link Mar 23, 2021  2135 CBC is normal.  [CS]  2248 Patient's US shows cholelithiasis without cholecystitis. Her CBD is mildly dilated as well, will awaiting LFT/lipase to correlate for lab evidence of obstruction.  [CS]  2302 Care of the patient signed out to Dr. Pilar Plate at the change of shift.  [CS]    Clinical Course User Index [CS] Pollyann Savoy, MD     MDM Rules/Calculators/A&P Medical Decision Making Problems Addressed: Biliary colic: acute illness or injury  that poses a threat to life or bodily functions  Amount and/or Complexity of Data Reviewed Labs: ordered. Decision-making details documented in ED Course. Radiology: ordered and independent interpretation performed. Decision-making details documented in ED Course.  Risk OTC drugs. Prescription drug management. Decision regarding hospitalization.    Final Clinical Impression(s) / ED Diagnoses Final diagnoses:  Biliary colic    Rx / DC Orders ED Discharge Orders     None        Pollyann Savoy, MD 03/23/21 2306

## 2021-03-23 NOTE — ED Provider Notes (Signed)
°  Provider Note MRN:  [id number]  Arrival date & time: 03/24/21    ED Course and Medical Decision Making  Assumed care from Dr. Karle Starch at shift change.  Known gallstones, worsening pain, awaiting labs.  Dilated duct on ultrasound, question need for general surgery consultation versus admission for pain.  LFTs are elevated, admitted to medicine for further care.  Procedures  Final Clinical Impressions(s) / ED Diagnoses     ICD-10-CM   1. Biliary colic  XX123456     2. LFT elevation  R79.89       ED Discharge Orders     None       Discharge Instructions   None     Barth Kirks. Sedonia Small, [place] [email or web address]    Maudie Flakes, MD 03/24/21 [phone]

## 2021-03-24 ENCOUNTER — Encounter (HOSPITAL_COMMUNITY): Payer: Self-pay | Admitting: Internal Medicine

## 2021-03-24 ENCOUNTER — Inpatient Hospital Stay (HOSPITAL_COMMUNITY): Payer: Medicare Other

## 2021-03-24 DIAGNOSIS — K8020 Calculus of gallbladder without cholecystitis without obstruction: Secondary | ICD-10-CM | POA: Diagnosis present

## 2021-03-24 DIAGNOSIS — Q453 Other congenital malformations of pancreas and pancreatic duct: Secondary | ICD-10-CM | POA: Diagnosis not present

## 2021-03-24 DIAGNOSIS — Z79899 Other long term (current) drug therapy: Secondary | ICD-10-CM | POA: Diagnosis not present

## 2021-03-24 DIAGNOSIS — M1990 Unspecified osteoarthritis, unspecified site: Secondary | ICD-10-CM | POA: Diagnosis present

## 2021-03-24 DIAGNOSIS — E440 Moderate protein-calorie malnutrition: Secondary | ICD-10-CM | POA: Diagnosis not present

## 2021-03-24 DIAGNOSIS — Z7982 Long term (current) use of aspirin: Secondary | ICD-10-CM | POA: Diagnosis not present

## 2021-03-24 DIAGNOSIS — Z885 Allergy status to narcotic agent status: Secondary | ICD-10-CM | POA: Diagnosis not present

## 2021-03-24 DIAGNOSIS — R1013 Epigastric pain: Secondary | ICD-10-CM | POA: Diagnosis not present

## 2021-03-24 DIAGNOSIS — E119 Type 2 diabetes mellitus without complications: Secondary | ICD-10-CM | POA: Diagnosis not present

## 2021-03-24 DIAGNOSIS — E1169 Type 2 diabetes mellitus with other specified complication: Secondary | ICD-10-CM | POA: Diagnosis not present

## 2021-03-24 DIAGNOSIS — K8071 Calculus of gallbladder and bile duct without cholecystitis with obstruction: Secondary | ICD-10-CM | POA: Diagnosis not present

## 2021-03-24 DIAGNOSIS — R7989 Other specified abnormal findings of blood chemistry: Secondary | ICD-10-CM

## 2021-03-24 DIAGNOSIS — Z800 Family history of malignant neoplasm of digestive organs: Secondary | ICD-10-CM | POA: Diagnosis not present

## 2021-03-24 DIAGNOSIS — E1165 Type 2 diabetes mellitus with hyperglycemia: Secondary | ICD-10-CM | POA: Diagnosis present

## 2021-03-24 DIAGNOSIS — Z20822 Contact with and (suspected) exposure to covid-19: Secondary | ICD-10-CM | POA: Diagnosis not present

## 2021-03-24 DIAGNOSIS — F32A Depression, unspecified: Secondary | ICD-10-CM | POA: Diagnosis not present

## 2021-03-24 DIAGNOSIS — K229 Disease of esophagus, unspecified: Secondary | ICD-10-CM | POA: Diagnosis not present

## 2021-03-24 DIAGNOSIS — E669 Obesity, unspecified: Secondary | ICD-10-CM

## 2021-03-24 DIAGNOSIS — E876 Hypokalemia: Secondary | ICD-10-CM | POA: Diagnosis not present

## 2021-03-24 DIAGNOSIS — R109 Unspecified abdominal pain: Secondary | ICD-10-CM | POA: Diagnosis present

## 2021-03-24 DIAGNOSIS — K851 Biliary acute pancreatitis without necrosis or infection: Secondary | ICD-10-CM | POA: Diagnosis not present

## 2021-03-24 DIAGNOSIS — K8050 Calculus of bile duct without cholangitis or cholecystitis without obstruction: Secondary | ICD-10-CM

## 2021-03-24 DIAGNOSIS — G4733 Obstructive sleep apnea (adult) (pediatric): Secondary | ICD-10-CM | POA: Diagnosis not present

## 2021-03-24 DIAGNOSIS — K5900 Constipation, unspecified: Secondary | ICD-10-CM | POA: Diagnosis present

## 2021-03-24 DIAGNOSIS — Z6831 Body mass index (BMI) 31.0-31.9, adult: Secondary | ICD-10-CM | POA: Diagnosis not present

## 2021-03-24 DIAGNOSIS — E785 Hyperlipidemia, unspecified: Secondary | ICD-10-CM | POA: Diagnosis not present

## 2021-03-24 DIAGNOSIS — Z7984 Long term (current) use of oral hypoglycemic drugs: Secondary | ICD-10-CM | POA: Diagnosis not present

## 2021-03-24 DIAGNOSIS — Z96652 Presence of left artificial knee joint: Secondary | ICD-10-CM | POA: Diagnosis present

## 2021-03-24 LAB — CBC WITH DIFFERENTIAL/PLATELET
Abs Immature Granulocytes: 0.04 10*3/uL (ref 0.00–0.07)
Basophils Absolute: 0 10*3/uL (ref 0.0–0.1)
Basophils Relative: 0 %
Eosinophils Absolute: 0 10*3/uL (ref 0.0–0.5)
Eosinophils Relative: 0 %
HCT: 34.1 % — ABNORMAL LOW (ref 36.0–46.0)
Hemoglobin: 11.3 g/dL — ABNORMAL LOW (ref 12.0–15.0)
Immature Granulocytes: 0 %
Lymphocytes Relative: 6 %
Lymphs Abs: 0.6 10*3/uL — ABNORMAL LOW (ref 0.7–4.0)
MCH: 27.5 pg (ref 26.0–34.0)
MCHC: 33.1 g/dL (ref 30.0–36.0)
MCV: 83 fL (ref 80.0–100.0)
Monocytes Absolute: 0.7 10*3/uL (ref 0.1–1.0)
Monocytes Relative: 7 %
Neutro Abs: 8.3 10*3/uL — ABNORMAL HIGH (ref 1.7–7.7)
Neutrophils Relative %: 87 %
Platelets: 182 10*3/uL (ref 150–400)
RBC: 4.11 MIL/uL (ref 3.87–5.11)
RDW: 14.5 % (ref 11.5–15.5)
WBC: 9.5 10*3/uL (ref 4.0–10.5)
nRBC: 0 % (ref 0.0–0.2)

## 2021-03-24 LAB — GLUCOSE, CAPILLARY
Glucose-Capillary: 184 mg/dL — ABNORMAL HIGH (ref 70–99)
Glucose-Capillary: 148 mg/dL — ABNORMAL HIGH (ref 70–99)
Glucose-Capillary: 159 mg/dL — ABNORMAL HIGH (ref 70–99)
Glucose-Capillary: 137 mg/dL — ABNORMAL HIGH (ref 70–99)
Glucose-Capillary: 161 mg/dL — ABNORMAL HIGH (ref 70–99)
Glucose-Capillary: 140 mg/dL — ABNORMAL HIGH (ref 70–99)

## 2021-03-24 LAB — BASIC METABOLIC PANEL
Anion gap: 9 (ref 5–15)
BUN: 13 mg/dL (ref 8–23)
CO2: 27 mmol/L (ref 22–32)
Calcium: 8.8 mg/dL — ABNORMAL LOW (ref 8.9–10.3)
Chloride: 97 mmol/L — ABNORMAL LOW (ref 98–111)
Creatinine, Ser: 0.55 mg/dL (ref 0.44–1.00)
GFR, Estimated: >60 mL/min (ref >60)
Glucose, Bld: 184 mg/dL — ABNORMAL HIGH (ref 70–99)
Potassium: 3.5 mmol/L (ref 3.5–5.1)
Sodium: 133 mmol/L — ABNORMAL LOW (ref 135–145)

## 2021-03-24 LAB — HEPATIC FUNCTION PANEL
ALT: 143 U/L — ABNORMAL HIGH (ref 0–44)
AST: 160 U/L — ABNORMAL HIGH (ref 15–41)
Albumin: 3 g/dL — ABNORMAL LOW (ref 3.5–5.0)
Alkaline Phosphatase: 279 U/L — ABNORMAL HIGH (ref 38–126)
Bilirubin, Direct: 2.3 mg/dL — ABNORMAL HIGH (ref 0.0–0.2)
Indirect Bilirubin: 1.3 mg/dL — ABNORMAL HIGH (ref 0.3–0.9)
Total Bilirubin: 3.6 mg/dL — ABNORMAL HIGH (ref 0.3–1.2)
Total Protein: 6.5 g/dL (ref 6.5–8.1)

## 2021-03-24 LAB — URINALYSIS, ROUTINE W REFLEX MICROSCOPIC
Bilirubin Urine: NEGATIVE
Glucose, UA: NEGATIVE mg/dL
Hgb urine dipstick: NEGATIVE
Ketones, ur: 20 mg/dL — AB
Leukocytes,Ua: NEGATIVE
Nitrite: NEGATIVE
Protein, ur: 30 mg/dL — AB
Specific Gravity, Urine: 1.014 (ref 1.005–1.030)
pH: 6 (ref 5.0–8.0)

## 2021-03-24 LAB — RESP PANEL BY RT-PCR (FLU A&B, COVID) ARPGX2
Influenza A by PCR: NEGATIVE
Influenza B by PCR: NEGATIVE
SARS Coronavirus 2 by RT PCR: NEGATIVE

## 2021-03-24 LAB — LIPASE, BLOOD: Lipase: 1016 U/L — ABNORMAL HIGH (ref 11–51)

## 2021-03-24 MED ORDER — KCL IN DEXTROSE-NACL 20-5-0.9 MEQ/L-%-% IV SOLN
INTRAVENOUS | Status: DC
  Filled 2021-03-24 (×6): qty 1000

## 2021-03-24 MED ORDER — ONDANSETRON HCL 4 MG/2ML IJ SOLN: 4.0000 mg | Freq: Four times a day (QID) | INTRAMUSCULAR | Status: DC | PRN

## 2021-03-24 MED ORDER — LACTATED RINGERS IV SOLN: INTRAVENOUS | Status: DC

## 2021-03-24 MED ORDER — INSULIN ASPART 100 UNIT/ML IJ SOLN
0.0000 [IU] | INTRAMUSCULAR | Status: DC
  Administered 2021-03-24: 21:00:00 2 [IU] via SUBCUTANEOUS
  Administered 2021-03-24 (×2): 1 [IU] via SUBCUTANEOUS
  Administered 2021-03-24 – 2021-03-25 (×3): 2 [IU] via SUBCUTANEOUS
  Administered 2021-03-25 (×3): 1 [IU] via SUBCUTANEOUS
  Administered 2021-03-25: 13:00:00 3 [IU] via SUBCUTANEOUS
  Administered 2021-03-26: 06:00:00 2 [IU] via SUBCUTANEOUS
  Administered 2021-03-26 – 2021-03-27 (×5): 1 [IU] via SUBCUTANEOUS
  Administered 2021-03-27: 2 [IU] via SUBCUTANEOUS
  Administered 2021-03-27: 3 [IU] via SUBCUTANEOUS
  Administered 2021-03-27 (×2): 1 [IU] via SUBCUTANEOUS
  Administered 2021-03-27: 2 [IU] via SUBCUTANEOUS
  Administered 2021-03-28: 1 [IU] via SUBCUTANEOUS
  Administered 2021-03-28: 2 [IU] via SUBCUTANEOUS

## 2021-03-24 MED ORDER — ACETAMINOPHEN 500 MG PO TABS: 500.0000 mg | ORAL_TABLET | Freq: Four times a day (QID) | ORAL | Status: DC | PRN

## 2021-03-24 MED ORDER — ARIPIPRAZOLE 5 MG PO TABS
5.0000 mg | ORAL_TABLET | Freq: Every day | ORAL | Status: DC
  Administered 2021-03-24 – 2021-03-27 (×4): 5 mg via ORAL
  Filled 2021-03-24 (×4): qty 1

## 2021-03-24 MED ORDER — AMPHETAMINE-DEXTROAMPHET ER 10 MG PO CP24
20.0000 mg | ORAL_CAPSULE | Freq: Every day | ORAL | Status: DC
  Administered 2021-03-24 – 2021-03-28 (×4): 20 mg via ORAL
  Filled 2021-03-24 (×4): qty 2

## 2021-03-24 MED ORDER — MORPHINE SULFATE (PF) 2 MG/ML IV SOLN
1.0000 mg | INTRAVENOUS | Status: DC | PRN
  Administered 2021-03-24 – 2021-03-26 (×3): 1 mg via INTRAVENOUS
  Filled 2021-03-24 (×3): qty 1

## 2021-03-24 NOTE — Progress Notes (Signed)
Initial Nutrition Assessment  DOCUMENTATION CODES:   Non-severe (moderate) malnutrition in context of acute illness/injury  INTERVENTION:   -Ensure MAX Protein po BID, each supplement provides 150 kcal and 30 grams of protein   -Multivitamin with minerals daily  NUTRITION DIAGNOSIS:   Moderate Malnutrition related to acute illness as evidenced by percent weight loss, energy intake < 75% for > 7 days.  GOAL:   Patient will meet greater than or equal to 90% of their needs  MONITOR:   PO intake, Supplement acceptance, Diet advancement, Labs, Weight trends, I & O's  REASON FOR ASSESSMENT:   Malnutrition Screening Tool    ASSESSMENT:   71 y.o. female with history of diabetes mellitus type 2, depression, hyperlipidemia, sleep apnea presents for evaluation of gallstone pancreatitis with possible choledocholithiasis.    Patient originally presented to ED with complaints of abdominal pain (epigastric radiating to the back) associated with nausea and vomiting.  Patient sitting on side of bed, daughter at bedside. Per pt and daughter, pt has not been eating well since her knee surgery in November 2022 (s/p TKR 11/1). Pt has had intermittent N/V since then. At times pt was not able to keep down popsicles or Boost. Over the past week she has not eaten much at all.  Pt was NPO this morning for MRCP. Per GI note, will need ERCP if CBD stone present. Will need cholecystectomy if MRCP was negative for CBD.  Pt now on clear liquids. Pt agreeable to Ensure Max supplements once diet advanced.   Per weight records, pt has lost 17 lbs since 02/07/21 (8% wt loss x 1.5 months, significant for time frame) . Pt's daughter states she has lost 25 lbs total.  Medications: D5 infusion  Labs reviewed:  CBGs: 148-184 Low Na  NUTRITION - FOCUSED PHYSICAL EXAM:  No depletions noted.  Diet Order:   Diet Order             Diet clear liquid Room service appropriate? Yes; Fluid consistency: Thin   Diet effective now                   EDUCATION NEEDS:   No education needs have been identified at this time  Skin:  Skin Assessment: Reviewed RN Assessment  Last BM:  1/22  Height:   Ht Readings from Last 1 Encounters:  03/24/21 5\' 5"  (1.651 m)    Weight:   Wt Readings from Last 1 Encounters:  03/24/21 85 kg    BMI:  Body mass index is 31.18 kg/m.  Estimated Nutritional Needs:   Kcal:  1650-1850  Protein:  75-90g  Fluid:  1.8L/day  Clayton Bibles, MS, RD, LDN Inpatient Clinical Dietitian Contact information available via Amion

## 2021-03-24 NOTE — H&P (Signed)
History and Physical    Sabrina Arroyo [id number] DOB: Jan 17, 1951 DOA: 03/23/2021  PCP: Deeann Cree, MD  Patient coming from: Home.  Chief Complaint: Abdominal pain.  HPI: Sabrina Arroyo is a 71 y.o. female with history of diabetes mellitus type 2, depression, hyperlipidemia, sleep apnea presents to the ER with complaints of abdominal pain.  Patient's abdominal pain is mostly epigastric radiating to back with nausea vomiting sharp stabbing type.  Started happening around noontime yesterday.  Had at least 2-3 episodes of vomiting.  Patient had a similar episode about a week ago had gone to the ER.  Since then had followed up with the primary care.  ED Course: In the ER patient had labs done which shows elevated lipase of 1000 and AST of 105 ALT of 148 total bilirubin of 3.6.  Sonogram of the abdomen shows gallbladder stones with no features of acute cholecystitis and dilated extrahepatic biliary ducts.  Patient admitted for further work-up of acute gallstone pancreatitis with concerning features for possible choledocholithiasis.  COVID test was negative.  Review of Systems: As per HPI, rest all negative.   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 She has never used smokeless tobacco. She reports that she does not currently use alcohol. She reports that she does not use drugs.  Allergies  Allergen Reactions   Codeine Itching and Rash    History reviewed. No pertinent family  history.  Prior to Admission medications   Medication Sig Start Date End Date Taking? Authorizing Provider  amphetamine-dextroamphetamine (ADDERALL XR) 20 MG 24 hr capsule Take 20 mg by mouth daily. 10/25/19  Yes [provider]  ARIPiprazole (ABILIFY) 5 MG tablet Take 5 mg by mouth at bedtime. 11/17/20  Yes [provider]  aspirin 81 MG EC tablet Take 81 mg by mouth daily. 11/11/16  Yes [provider]  metFORMIN (GLUCOPHAGE-XR) 500 MG 24 hr tablet Take 1,000 mg by mouth 2 (two) times daily. 11/27/20  Yes [provider]  Multiple Vitamins-Minerals (MULTIVITAMIN WITH MINERALS) tablet Take 1 tablet by mouth daily. Woman   Yes [provider]  omeprazole (PRILOSEC) 20 MG capsule Take 20 mg by mouth at bedtime. 11/05/20  Yes [provider]  ondansetron (ZOFRAN) 4 MG tablet Take 1 tablet (4 mg total) by mouth daily as needed for nausea or vomiting. 03/19/21 03/19/22 Yes Rada Hay, MD  pramipexole (MIRAPEX) 0.5 MG tablet Take 0.5 mg by mouth at bedtime. 07/18/20  Yes [provider]  rosuvastatin (CRESTOR) 40 MG tablet Take 40 mg by mouth at bedtime. 11/27/20  Yes [provider]  traMADol (ULTRAM) 50 MG tablet Take 1 tablet (50 mg total) by mouth every 6 (six) hours as needed. 01/02/21  Yes Duanne Guess, PA-C  vortioxetine HBr (TRINTELLIX) 20 MG TABS tablet Take 20 mg by mouth at bedtime. 12/03/17  Yes [provider]  amoxicillin (AMOXIL) 500 MG capsule Take  2,000 mg by mouth once. Patient not taking: Reported on 03/24/2021 11/15/20 11/15/21  [provider]    Physical Exam: Constitutional: Moderately built and nourished. Vitals:   03/23/21 2330 03/23/21 2345 03/24/21 0115 03/24/21 0205  BP: 139/65  (!) 154/75   Pulse: 64 65 69   Resp: 18 19 20    Temp:   99.9 F (37.7 C)   TempSrc:   Oral   SpO2: 98% 98% 100%   Weight:    85 kg  Height:    5\' 5"  (1.651 m)   Eyes: Anicteric no pallor. ENMT: No  discharge from the ears eyes nose and mouth. Neck: No mass felt.  No neck rigidity. Respiratory: No rhonchi or crepitations. Cardiovascular: S1-S2 heard. Abdomen: Epigastric tenderness no guarding or rigidity. Musculoskeletal: No edema. Skin: No rash. Neurologic: Alert awake oriented time place and person.  Moves all extremities. Psychiatric: Appears normal.  Normal affect.   Labs on Admission: I have personally reviewed following labs and imaging studies  CBC: Recent Labs  Lab 03/18/21 2019 03/23/21 2051  WBC 10.1 9.4  HGB 12.8 13.7  HCT 40.0 42.9  MCV 85.3 84.1  PLT 228 [id number]   Basic Metabolic Panel: Recent Labs  Lab 03/18/21 2019 03/23/21 2051  NA 137 137  K 4.0 3.2*  CL 100 98  CO2 25 26  GLUCOSE 185* 196*  BUN 21 16  CREATININE 0.74 0.74  CALCIUM 9.8 9.7   GFR: Estimated Creatinine Clearance: 68.4 mL/min (by C-G formula based on SCr of 0.74 mg/dL). Liver Function Tests: Recent Labs  Lab 03/18/21 2019 03/23/21 2051  AST 28 105*  ALT 27 148*  ALKPHOS 70 299*  BILITOT 0.8 3.6*  PROT 7.6 8.2*  ALBUMIN 4.0 4.1   Recent Labs  Lab 03/18/21 2019 03/23/21 2051  LIPASE 55* 1,016*   No results for input(s): AMMONIA in the last 168 hours. Coagulation Profile: No results for input(s): INR, PROTIME in the last 168 hours. Cardiac Enzymes: No results for input(s): CKTOTAL, CKMB, CKMBINDEX, TROPONINI in the last 168 hours. BNP (last 3 results) No results for input(s): PROBNP in the last 8760 hours. HbA1C: No results for input(s): HGBA1C in the last 72 hours. CBG: No results for input(s): GLUCAP in the last 168 hours. Lipid Profile: No results for input(s): CHOL, HDL, LDLCALC, TRIG, CHOLHDL, LDLDIRECT in the last 72 hours. Thyroid Function Tests: No results for input(s): TSH, T4TOTAL, FREET4, T3FREE, THYROIDAB in the last 72 hours. Anemia Panel: No results for input(s): VITAMINB12, FOLATE, FERRITIN, TIBC, IRON, RETICCTPCT in the last 72 hours. Urine  analysis:    Component Value Date/Time   COLORURINE AMBER (A) 03/23/2021 2051   APPEARANCEUR HAZY (A) 03/23/2021 2051   LABSPEC 1.014 03/23/2021 2051   PHURINE 6.0 03/23/2021 2051   GLUCOSEU NEGATIVE 03/23/2021 2051   HGBUR NEGATIVE 03/23/2021 2051   [place] NEGATIVE 03/23/2021 2051   KETONESUR 20 (A) 03/23/2021 2051   PROTEINUR 30 (A) 03/23/2021 2051   NITRITE NEGATIVE 03/23/2021 2051   LEUKOCYTESUR NEGATIVE 03/23/2021 2051   Sepsis Labs: @LABRCNTIP (procalcitonin:4,lacticidven:4) ) Recent Results (from the past 240 hour(s))  Resp Panel by RT-PCR (Flu A&B, Covid) Nasopharyngeal Swab     Status: None   Collection Time: 03/24/21 12:58 AM   Specimen: Nasopharyngeal Swab; Nasopharyngeal(NP) swabs in vial transport medium  Result Value Ref Range Status   SARS Coronavirus 2 by RT PCR NEGATIVE NEGATIVE Final    Comment: (NOTE) SARS-CoV-2 target nucleic acids are NOT DETECTED.  The SARS-CoV-2 RNA is  generally detectable in upper respiratory specimens during the acute phase of infection. The lowest concentration of SARS-CoV-2 viral copies this assay can detect is 138 copies/mL. A negative result does not preclude SARS-Cov-2 infection and should not be used as the sole basis for treatment or other patient management decisions. A negative result may occur with  improper specimen collection/handling, submission of specimen other than nasopharyngeal swab, presence of viral mutation(s) within the areas targeted by this assay, and inadequate number of viral copies(<138 copies/mL). A negative result must be combined with clinical observations, patient history, and epidemiological information. The expected result is Negative.  Fact Sheet for Patients:  EntrepreneurPulse.com.au  Fact Sheet for Healthcare Providers:  IncredibleEmployment.be  This test is no t yet approved or cleared by the [place] FDA and  has been authorized for detection  and/or diagnosis of SARS-CoV-2 by FDA under an Emergency Use Authorization (EUA). This EUA will remain  in effect (meaning this test can be used) for the duration of the COVID-19 declaration under Section 564(b)(1) of the Act, 21 U.S.C.section 360bbb-3(b)(1), unless the authorization is terminated  or revoked sooner.       Influenza A by PCR NEGATIVE NEGATIVE Final   Influenza B by PCR NEGATIVE NEGATIVE Final    Comment: (NOTE) The Xpert Xpress SARS-CoV-2/FLU/RSV plus assay is intended as an aid in the diagnosis of influenza from Nasopharyngeal swab specimens and should not be used as a sole basis for treatment. Nasal washings and aspirates are unacceptable for Xpert Xpress SARS-CoV-2/FLU/RSV testing.  Fact Sheet for Patients: EntrepreneurPulse.com.au  Fact Sheet for Healthcare Providers: IncredibleEmployment.be  This test is not yet approved or cleared by the [place] FDA and has been authorized for detection and/or diagnosis of SARS-CoV-2 by FDA under an Emergency Use Authorization (EUA). This EUA will remain in effect (meaning this test can be used) for the duration of the COVID-19 declaration under Section 564(b)(1) of the Act, 21 U.S.C. section 360bbb-3(b)(1), unless the authorization is terminated or revoked.  Performed at [facility], [place] [address], [place],  [address]      Radiological Exams on Admission: US Abdomen Limited  Result Date: 03/23/2021 CLINICAL DATA:  Right upper quadrant abdominal pain EXAM: ULTRASOUND ABDOMEN LIMITED RIGHT UPPER QUADRANT COMPARISON:  None. FINDINGS: Gallbladder: Multiple layering gallstones are seen within the gallbladder. The gallbladder, however, is not distended, there is no gallbladder wall thickening, and no pericholecystic fluid is identified. The sonographic Percell Miller sign is reportedly negative. Common bile duct: Diameter: Dilated measuring 9-10 mm in proximal  diameter. Liver: No focal lesion identified. Within normal limits in parenchymal echogenicity. Portal vein is patent on color Doppler imaging with normal direction of blood flow towards the liver. Other: None. IMPRESSION: Cholelithiasis without sonographic evidence of acute cholecystitis. Dilation of the extrahepatic bile duct. A distal obstructing lesion is not excluded and correlation with liver enzymes may be helpful. If abnormal, ERCP or MRCP examination would be more helpful for assessment of the distal extrahepatic bile duct. Electronically Signed   By: Fidela Salisbury M.D.   On: 03/23/2021 22:41      Assessment/Plan Principal Problem:   Abdominal pain Active Problems:   Gallstones   Elevated LFTs   Diabetes mellitus type 2 in obese (HCC)    Acute gallstone pancreatitis -we will get MRCP to rule out choledocholithiasis.  We will keep patient n.p.o. aggressive IV fluid hydration consult GI.  Patient eventually need cholecystectomy. History of diabetes mellitus type 2 we will keep patient on  sliding scale coverage. Hyperlipidemia presently NPO. History of depression presently NPO. Mild hypokalemia replace and recheck. History of sleep apnea.   Since patient is having acute pancreatitis with possible choledocholithiasis which will need further management and procedures will need inpatient status.   DVT prophylaxis: SCDs.  Avoiding anticoagulation in anticipation of possible procedure. Code Status: Full code. Family Communication: Daughter at the bedside. Disposition Plan: Home. Consults called: On-call gastroenterologist through secure chat. Admission status: Inpatient.   Rise Patience MD [facility] Pager [phone].  If 7PM-7AM, please contact night-coverage [email or web address] Password [email or web address]  03/24/2021, 3:52 AM

## 2021-03-24 NOTE — Consult Note (Signed)
Referring Provider: [facility] Primary Care Physician:  Deeann Cree, MD Primary Gastroenterologist:  Laverle Patter)  Reason for Consultation: Gallstone pancreatitis with possible choledocholithiasis  HPI: Sabrina Arroyo is a 71 y.o. female with history of diabetes mellitus type 2, depression, hyperlipidemia, sleep apnea presents for evaluation of gallstone pancreatitis with possible choledocholithiasis.  Patient originally presented to ED with complaints of abdominal pain (epigastric radiating to the back) associated with nausea and vomiting. Nonbloody emesis.  Patient states she was scheduled to see surgeon today to discuss cholecystectomy as she has had some intermittent RUQ pain over the last month. Though, when she began having the persistent epigastric pain she came to the hospital. Lipase was elevated at 1000.  AST 105/ALT 148/total bilirubin 3.6. denies NSAID use. Denies tobacco and alcohol use.denies melena/hematochezia. Denies weight loss. Denies dysphagia. Family history of colon cancer in mother who died at age 86.  EGD done 03/01/2021 for abnormal imaging showing thickening of the distal esophagus.  Normal esophagus, normal gastric body, antrum, cardia, gastric fundus, normal duodenum.  No specimens collected. 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 Authorizing Provider  amphetamine-dextroamphetamine (ADDERALL  XR) 20 MG 24 hr capsule Take 20 mg by mouth daily. 10/25/19  Yes [provider]  ARIPiprazole (ABILIFY) 5 MG tablet Take 5 mg by mouth at bedtime. 11/17/20  Yes [provider]  aspirin 81 MG EC tablet Take 81 mg by mouth daily. 11/11/16  Yes [provider]  metFORMIN (GLUCOPHAGE-XR) 500 MG 24 hr tablet Take 1,000 mg by mouth 2 (two) times daily. 11/27/20  Yes [provider]  Multiple Vitamins-Minerals (MULTIVITAMIN WITH MINERALS) tablet Take 1 tablet by mouth daily. Woman   Yes [provider]  omeprazole (PRILOSEC) 20 MG capsule Take 20 mg by mouth at bedtime. 11/05/20  Yes [provider]  ondansetron (ZOFRAN) 4 MG tablet Take 1 tablet (4 mg total) by mouth daily as needed for nausea or vomiting. 03/19/21 03/19/22 Yes Rada Hay, MD  pramipexole (MIRAPEX) 0.5 MG tablet Take 0.5 mg by mouth at bedtime. 07/18/20  Yes [provider]  rosuvastatin (CRESTOR) 40 MG tablet Take 40 mg by mouth at bedtime. 11/27/20  Yes [provider]  traMADol (ULTRAM) 50 MG tablet Take 1 tablet (50 mg total) by mouth every 6 (six) hours as needed. 01/02/21  Yes Duanne Guess, PA-C  vortioxetine HBr (TRINTELLIX) 20 MG TABS tablet Take 20 mg by mouth at bedtime. 12/03/17  Yes [provider]  amoxicillin (AMOXIL) 500 MG capsule Take 2,000 mg by mouth once. Patient not taking: Reported on 03/24/2021 11/15/20 11/15/21  [provider]    Scheduled Meds:  insulin aspart  0-9 Units Subcutaneous Q4H   Continuous Infusions:  lactated ringers 200 mL/hr at 03/24/21 0226   PRN Meds:.morphine injection  Allergies as of 03/23/2021 - Review Complete  03/23/2021  Allergen Reaction Noted   Codeine Itching and Rash 07/10/2017    History reviewed. No pertinent family history.  Social History   Socioeconomic History   Marital status: Divorced    Spouse name: Not on file   Number of children: Not on file   Years of education: Not on  file   Highest education level: Not on file  Occupational History   Not on file  Tobacco Use   Smoking status: Never   Smokeless tobacco: Never  Substance and Sexual Activity   Alcohol use: Not Currently   Drug use: Never   Sexual activity: Not on file  Other Topics Concern   Not on file  Social History Narrative   Not on file   Social Determinants of Health   Financial Resource Strain: Not on file  Food Insecurity: Not on file  Transportation Needs: Not on file  Physical Activity: Not on file  Stress: Not on file  Social Connections: Not on file  Intimate Partner Violence: Not on file    Review of Systems: Review of Systems  Constitutional:  Negative for chills and fever.  HENT:  Negative for congestion and sinus pain.   Eyes:  Negative for blurred vision and double vision.  Respiratory:  Negative for cough and hemoptysis.   Cardiovascular:  Negative for chest pain and palpitations.  Gastrointestinal:  Positive for abdominal pain, nausea and vomiting. Negative for blood in stool, constipation, diarrhea, heartburn and melena.  Genitourinary:  Negative for dysuria and urgency.  Musculoskeletal:  Negative for myalgias and neck pain.  Skin:  Negative for itching and rash.  Neurological:  Negative for seizures and loss of consciousness.  Psychiatric/Behavioral:  Negative for substance abuse. The patient is not nervous/anxious.   .  Physical Exam:Physical Exam Constitutional:      Appearance: Normal appearance.  HENT:     Head: Normocephalic and atraumatic.     Nose: Nose normal. No congestion.     Mouth/Throat:     Mouth: Mucous membranes are moist.     Pharynx: Oropharynx is clear.  Eyes:     Extraocular Movements: Extraocular movements intact.     Conjunctiva/sclera: Conjunctivae normal.  Cardiovascular:     Rate and Rhythm: Normal rate and regular rhythm.  Pulmonary:     Effort: Pulmonary effort is normal. No respiratory distress.  Abdominal:     General:  Abdomen is flat. Bowel sounds are normal. There is no distension.     Palpations: Abdomen is soft. There is no mass.     Tenderness: There is abdominal tenderness (generalized tenderness - worse in epigastrium). There is guarding. There is no rebound.     Hernia: No hernia is present.  Musculoskeletal:        General: No swelling. Normal range of motion.     Cervical back: Normal range of motion and neck supple.  Skin:    General: Skin is warm and dry.  Neurological:     General: No focal deficit present.     Mental Status: She is alert and oriented to person, place, and time.  Psychiatric:        Mood and Affect: Mood normal.        Behavior: Behavior normal.        Thought Content: Thought content normal.        Judgment: Judgment normal.    Vital signs: Vitals:   03/24/21 0115 03/24/21 0527  BP: (!) 154/75 (!) 121/58  Pulse: 69 77  Resp: 20 20  Temp: 99.9 F (37.7 C) 98.8 F (37.1 C)  SpO2: 100% 96%   Last BM Date: 03/23/21    GI:  Lab Results: Recent Labs    03/23/21 2051 03/24/21 0459  WBC 9.4 9.5  HGB 13.7 11.3*  HCT 42.9 34.1*  PLT 241 182   BMET Recent Labs    03/23/21 2051 03/24/21 0459  NA 137 133*  K 3.2* 3.5  CL 98 97*  CO2 26 27  GLUCOSE 196* 184*  BUN 16 13  CREATININE 0.74 0.55  CALCIUM 9.7 8.8*   LFT Recent Labs    03/24/21 0459  PROT 6.5  ALBUMIN 3.0*  AST 160*  ALT 143*  ALKPHOS 279*  BILITOT 3.6*  BILIDIR 2.3*  IBILI 1.3*   PT/INR No results for input(s): LABPROT, INR in the last 72 hours.   Studies/Results: US Abdomen Limited  Result Date: 03/23/2021 CLINICAL DATA:  Right upper quadrant abdominal pain EXAM: ULTRASOUND ABDOMEN LIMITED RIGHT UPPER QUADRANT COMPARISON:  None. FINDINGS: Gallbladder: Multiple layering gallstones are seen within the gallbladder. The gallbladder, however, is not distended, there is no gallbladder wall thickening, and no pericholecystic fluid is identified. The sonographic Percell Miller sign is  reportedly negative. Common bile duct: Diameter: Dilated measuring 9-10 mm in proximal diameter. Liver: No focal lesion identified. Within normal limits in parenchymal echogenicity. Portal vein is patent on color Doppler imaging with normal direction of blood flow towards the liver. Other: None. IMPRESSION: Cholelithiasis without sonographic evidence of acute cholecystitis. Dilation of the extrahepatic bile duct. A distal obstructing lesion is not excluded and correlation with liver enzymes may be helpful. If abnormal, ERCP or MRCP examination would be more helpful for assessment of the distal extrahepatic bile duct. Electronically Signed   By: Fidela Salisbury M.D.   On: 03/23/2021 22:41    Impression: Gallstone pancreatitis with possible choledocholithiasis -Lipase 1016 -AST 168/ALT 143/alk phos 279 -T bili 3.6 - No leukocytosis -Normal renal function -Abdominal ultrasound 1/22: cholelithiasis without cholecystitis.  Dilation of the extrahepatic bile duct.  Distal obstructing lesion. - MRCP pending   Plan: MRCP to rule out choledocholithiasis. If positive, will likely wait for improvement in pancreatitis before moving forward with ERCP. If negative, will continue to manage pancreatitis and consult surgical team about possible cholecystectomy with IOC as an outpatient after recovery from pancreatitis. Continue pain management and supportive care Continue aggressive IVF [facility] will follow   LOS: 0 days   Jaxsyn Azam Radford Pax  PA-C 03/24/2021, 8:06 AM  Contact #  [phone]

## 2021-03-24 NOTE — Progress Notes (Signed)
Sabrina Arroyo  ZOX:[id number] DOB: 18-Oct-1950 DOA: 03/23/2021 PCP: Allyne Gee, MD    Brief Narrative:  71yo with a history of DM2, depression, HLD, and sleep apnea who presented to the ED with epigastric abdominal pain which radiated through to the back described as sharp and stabbing in nature associated with nausea and vomiting.  There was an abrupt onset of the symptoms and they persisted for approximately 24 hours.  In the ER she was found to have a lipase of 1000 with mildly elevated transaminases and a total bilirubin of 3.6.  Ultrasound abdomen noted gallbladder stones without acute cholecystitis but dilated extrahepatic biliary ducts.  Consultants:  GI  Code Status: FULL CODE  Antimicrobials:  nonr  DVT prophylaxis: SCDs  Interim Hx: Patient was interviewed and examined by one of my partners earlier today.  She is seen for follow-up visit.  Assessment & Plan:  Acute gallstone pancreatitis MRCP pending to guide further tx   DM2  HLD  Depression  Hypokalemia  Sleep apnea   Family Communication: Spoke with patient and sister at bedside Disposition: From home -anticipate return home  Objective: Blood pressure 113/61, pulse 74, temperature 98.9 F (37.2 C), temperature source Oral, resp. rate 18, height 5\' 5"  (1.651 m), weight 85 kg, SpO2 96 %.  Intake/Output Summary (Last 24 hours) at 03/24/2021 0947 Last data filed at 03/24/2021 0000 Gross per 24 hour  Intake 1000 ml  Output --  Net 1000 ml   Filed Weights   03/24/21 0205  Weight: 85 kg    Examination: Patient is seen for follow-up exam  CBC: Recent Labs  Lab 03/18/21 2019 03/23/21 2051 03/24/21 0459  WBC 10.1 9.4 9.5  NEUTROABS  --   --  8.3*  HGB 12.8 13.7 11.3*  HCT 40.0 42.9 34.1*  MCV 85.3 84.1 83.0  PLT 228 241 182   Basic Metabolic Panel: Recent Labs  Lab 03/18/21 2019 03/23/21 2051 03/24/21 0459  NA 137 137 133*  K 4.0 3.2* 3.5  CL 100 98 97*  CO2 25 26 27    GLUCOSE 185* 196* 184*  BUN 21 16 13   CREATININE 0.74 0.74 0.55  CALCIUM 9.8 9.7 8.8*   GFR: Estimated Creatinine Clearance: 68.4 mL/min (by C-G formula based on SCr of 0.55 mg/dL).  Liver Function Tests: Recent Labs  Lab 03/18/21 2019 03/23/21 2051 03/24/21 0459  AST 28 105* 160*  ALT 27 148* 143*  ALKPHOS 70 299* 279*  BILITOT 0.8 3.6* 3.6*  PROT 7.6 8.2* 6.5  ALBUMIN 4.0 4.1 3.0*   Recent Labs  Lab 03/18/21 2019 03/23/21 2051  LIPASE 55* 1,016*    CBG: Recent Labs  Lab 03/24/21 0445 03/24/21 0743  GLUCAP 184* 148*    Recent Results (from the past 240 hour(s))  Resp Panel by RT-PCR (Flu A&B, Covid) Nasopharyngeal Swab     Status: None   Collection Time: 03/24/21 12:58 AM   Specimen: Nasopharyngeal Swab; Nasopharyngeal(NP) swabs in vial transport medium  Result Value Ref Range Status   SARS Coronavirus 2 by RT PCR NEGATIVE NEGATIVE Final    Comment: (NOTE) SARS-CoV-2 target nucleic acids are NOT DETECTED.  The SARS-CoV-2 RNA is generally detectable in upper respiratory specimens during the acute phase of infection. The lowest concentration of SARS-CoV-2 viral copies this assay can detect is 138 copies/mL. A negative result does not preclude SARS-Cov-2 infection and should not be used as the sole basis for treatment or other patient management decisions. A negative result may occur  with  improper specimen collection/handling, submission of specimen other than nasopharyngeal swab, presence of viral mutation(s) within the areas targeted by this assay, and inadequate number of viral copies(<138 copies/mL). A negative result must be combined with clinical observations, patient history, and epidemiological information. The expected result is Negative.  Fact Sheet for Patients:  BloggerCourse.com  Fact Sheet for Healthcare Providers:  SeriousBroker.it  This test is no t yet approved or cleared by the [place] FDA and  has been authorized for detection and/or diagnosis of SARS-CoV-2 by FDA under an Emergency Use Authorization (EUA). This EUA will remain  in effect (meaning this test can be used) for the duration of the COVID-19 declaration under Section 564(b)(1) of the Act, 21 U.S.C.section 360bbb-3(b)(1), unless the authorization is terminated  or revoked sooner.       Influenza A by PCR NEGATIVE NEGATIVE Final   Influenza B by PCR NEGATIVE NEGATIVE Final    Comment: (NOTE) The Xpert Xpress SARS-CoV-2/FLU/RSV plus assay is intended as an aid in the diagnosis of influenza from Nasopharyngeal swab specimens and should not be used as a sole basis for treatment. Nasal washings and aspirates are unacceptable for Xpert Xpress SARS-CoV-2/FLU/RSV testing.  Fact Sheet for Patients: BloggerCourse.com  Fact Sheet for Healthcare Providers: SeriousBroker.it  This test is not yet approved or cleared by the [place] FDA and has been authorized for detection and/or diagnosis of SARS-CoV-2 by FDA under an Emergency Use Authorization (EUA). This EUA will remain in effect (meaning this test can be used) for the duration of the COVID-19 declaration under Section 564(b)(1) of the Act, 21 U.S.C. section 360bbb-3(b)(1), unless the authorization is terminated or revoked.  Performed at [facility], [address] [place], [place] [address]      Scheduled Meds:  insulin aspart  0-9 Units Subcutaneous Q4H   Continuous Infusions:  lactated ringers Stopped (03/24/21 0826)     LOS: 0 days   Lonia Blood, MD [facility] Office  [phone] Pager - Text Page per Loretha Stapler  If 7PM-7AM, please contact night-coverage per Amion 03/24/2021, 9:47 AM

## 2021-03-24 NOTE — TOC CM/SW Note (Signed)
°  Transition of Care [facility]) Screening Note   Patient Details  Name: Lorah Kesselman Date of Birth: Apr 08, 1950   Transition of Care [facility]) CM/SW Contact:    Ross Ludwig, LCSW Phone Number: 03/24/2021, 7:01 PM    Transition of Care Department [facility]) has reviewed patient and no TOC needs have been identified at this time. We will continue to monitor patient advancement through interdisciplinary progression rounds. If new patient transition needs arise, please place a TOC consult.

## 2021-03-25 LAB — CBC
HCT: 32.3 % — ABNORMAL LOW (ref 36.0–46.0)
Hemoglobin: 10.4 g/dL — ABNORMAL LOW (ref 12.0–15.0)
MCH: 27.4 pg (ref 26.0–34.0)
MCHC: 32.2 g/dL (ref 30.0–36.0)
MCV: 85 fL (ref 80.0–100.0)
Platelets: 149 10*3/uL — ABNORMAL LOW (ref 150–400)
RBC: 3.8 MIL/uL — ABNORMAL LOW (ref 3.87–5.11)
RDW: 15.2 % (ref 11.5–15.5)
WBC: 5.3 10*3/uL (ref 4.0–10.5)
nRBC: 0 % (ref 0.0–0.2)

## 2021-03-25 LAB — COMPREHENSIVE METABOLIC PANEL
ALT: 126 U/L — ABNORMAL HIGH (ref 0–44)
AST: 94 U/L — ABNORMAL HIGH (ref 15–41)
Albumin: 2.8 g/dL — ABNORMAL LOW (ref 3.5–5.0)
Alkaline Phosphatase: 303 U/L — ABNORMAL HIGH (ref 38–126)
Anion gap: 6 (ref 5–15)
BUN: 10 mg/dL (ref 8–23)
CO2: 26 mmol/L (ref 22–32)
Calcium: 8.6 mg/dL — ABNORMAL LOW (ref 8.9–10.3)
Chloride: 102 mmol/L (ref 98–111)
Creatinine, Ser: 0.63 mg/dL (ref 0.44–1.00)
GFR, Estimated: >60 mL/min (ref >60)
Glucose, Bld: 155 mg/dL — ABNORMAL HIGH (ref 70–99)
Potassium: 3.6 mmol/L (ref 3.5–5.1)
Sodium: 134 mmol/L — ABNORMAL LOW (ref 135–145)
Total Bilirubin: 3 mg/dL — ABNORMAL HIGH (ref 0.3–1.2)
Total Protein: 6.3 g/dL — ABNORMAL LOW (ref 6.5–8.1)

## 2021-03-25 LAB — PHOSPHORUS: Phosphorus: 3.6 mg/dL (ref 2.5–4.6)

## 2021-03-25 LAB — GLUCOSE, CAPILLARY
Glucose-Capillary: 120 mg/dL — ABNORMAL HIGH (ref 70–99)
Glucose-Capillary: 144 mg/dL — ABNORMAL HIGH (ref 70–99)
Glucose-Capillary: 145 mg/dL — ABNORMAL HIGH (ref 70–99)
Glucose-Capillary: 181 mg/dL — ABNORMAL HIGH (ref 70–99)
Glucose-Capillary: 211 mg/dL — ABNORMAL HIGH (ref 70–99)
Glucose-Capillary: 95 mg/dL (ref 70–99)

## 2021-03-25 LAB — PROTIME-INR
INR: 1 (ref 0.8–1.2)
Prothrombin Time: 13.5 seconds (ref 11.4–15.2)

## 2021-03-25 LAB — LIPASE, BLOOD: Lipase: 46 U/L (ref 11–51)

## 2021-03-25 LAB — MAGNESIUM: Magnesium: 1.9 mg/dL (ref 1.7–2.4)

## 2021-03-25 MED ORDER — VORTIOXETINE HBR 5 MG PO TABS
20.0000 mg | ORAL_TABLET | Freq: Every day | ORAL | Status: DC
  Administered 2021-03-25 – 2021-03-27 (×3): 20 mg via ORAL
  Filled 2021-03-25 (×3): qty 4

## 2021-03-25 MED ORDER — PANTOPRAZOLE SODIUM 40 MG PO TBEC
40.0000 mg | DELAYED_RELEASE_TABLET | Freq: Every day | ORAL | Status: DC
  Administered 2021-03-27 – 2021-03-28 (×2): 40 mg via ORAL
  Filled 2021-03-25 (×3): qty 1

## 2021-03-25 MED ORDER — PRAMIPEXOLE DIHYDROCHLORIDE 0.25 MG PO TABS
0.5000 mg | ORAL_TABLET | Freq: Every day | ORAL | Status: DC
  Administered 2021-03-25 – 2021-03-27 (×3): 0.5 mg via ORAL
  Filled 2021-03-25 (×3): qty 2

## 2021-03-25 MED ORDER — PRAMIPEXOLE DIHYDROCHLORIDE 0.25 MG PO TABS: 0.5000 mg | ORAL_TABLET | Freq: Every day | ORAL | Status: DC

## 2021-03-25 NOTE — Progress Notes (Signed)
Pt placed on cpap for the night. °

## 2021-03-25 NOTE — Progress Notes (Signed)
[facility] Gastroenterology Progress Note  Sabrina Arroyo 71 y.o. 1950/04/14  CC: Choledocholithiasis   Subjective: Patient states she is still having epigastric pain.  Denies nausea and vomiting.  Would like to try to eat if she can.  ROS : Review of Systems  Gastrointestinal:  Positive for abdominal pain. Negative for blood in stool, constipation, diarrhea, heartburn, melena, nausea and vomiting.  Genitourinary:  Negative for dysuria and urgency.     Objective: Vital signs in last 24 hours: Vitals:   03/24/21 2049 03/25/21 0328  BP: 115/72 106/61  Pulse: 62 71  Resp: 20 20  Temp: 98.9 F (37.2 C) 98.2 F (36.8 C)  SpO2: 92% 99%    Physical Exam:  General:  Alert, cooperative, no distress, obese  Head:  Normocephalic, without obvious abnormality, atraumatic  Eyes:  Anicteric sclera, EOM's intact  Lungs:   Clear to auscultation bilaterally, respirations unlabored  Heart:  Regular rate and rhythm, S1, S2 normal  Abdomen:   Soft, epigastric tenderness, bowel sounds active all four quadrants,  no masses,     Lab Results: Recent Labs    03/24/21 0459 03/25/21 0509  NA 133* 134*  K 3.5 3.6  CL 97* 102  CO2 27 26  GLUCOSE 184* 155*  BUN 13 10  CREATININE 0.55 0.63  CALCIUM 8.8* 8.6*  MG  --  1.9  PHOS  --  3.6   Recent Labs    03/24/21 0459 03/25/21 0509  AST 160* 94*  ALT 143* 126*  ALKPHOS 279* 303*  BILITOT 3.6* 3.0*  PROT 6.5 6.3*  ALBUMIN 3.0* 2.8*   Recent Labs    03/24/21 0459 03/25/21 0509  WBC 9.5 5.3  NEUTROABS 8.3*  --   HGB 11.3* 10.4*  HCT 34.1* 32.3*  MCV 83.0 85.0  PLT 182 149*   Recent Labs    03/25/21 0509  LABPROT 13.5  INR 1.0      Assessment Choledocholithiasis -Lipase 1016 -AST 94/ALT 126/alk phos 303 -T bili 3.0 - No leukocytosis -Normal renal function -Abdominal ultrasound 1/22: cholelithiasis without cholecystitis.  Dilation of the extrahepatic bile duct.  Distal obstructing lesion. - MRCP 1/23: Cholelithiasis,  diffuse gallbladder wall thickening (cholecystitis cannot be excluded).  5 mm calculus in distal common bile duct.  Common bile duct measuring 6 mm.  Pancreas divisum   Plan: Plan for ERCP tomorrow. I thoroughly discussed procedure with the patient to include nature, alternatives, benefits, and risks (including but not limited to post ERCP pancreatitis, bleeding, infection, perforation, anesthesia/cardiac pulmonary complications).  Patient verbalized understanding and gave verbal consent to proceed with ERCP. Continue to trend LFTs Can have clear liquids as tolerated After ERCP, patient will benefit from surgical consult for consideration for cholecystectomy. [facility] will follow  Garnette Scheuermann PA-C 03/25/2021, 11:22 AM  Contact #  [phone]

## 2021-03-25 NOTE — Progress Notes (Signed)
Sabrina Arroyo  KKX:[id number] DOB: [birth year] DOA: 03/23/2021 PCP: Allyne Gee, MD    Brief Narrative:  70yo with a history of DM2, depression, HLD, and sleep apnea who presented to the ED with epigastric abdominal pain which radiated through to the back described as sharp and stabbing in nature associated with nausea and vomiting.  There was an abrupt onset of the symptoms and they persisted for approximately 24 hours.  In the ER she was found to have a lipase of 1000 with mildly elevated transaminases and a total bilirubin of 3.6.  Ultrasound abdomen noted gallbladder stones without acute cholecystitis but dilated extrahepatic biliary ducts.  Consultants:  GI  Code Status: FULL CODE  Antimicrobials:  nonr  DVT prophylaxis: SCDs  Interim Hx: Afebrile.  Vitals are stable.  CBG controlled.  LFTs have improved.  Lipase has normalized.  Patient examined while up ambulating in room.  She states she feels much better today.  She still has some epigastric pain but it is not as severe as yesterday.  Mild low-grade nausea but no vomiting.  She is ready to undergo ERCP tomorrow.  Assessment & Plan:  Acute gallstone pancreatitis MRCP noted common bile duct stone - ERCP tomorrow per GI -will ultimately require cholecystectomy as an outpatient  DM2 CBG controlled -continue SSI  HLD Hold medication until pancreatitis/biliary issues corrected  Depression Continue usual home medical therapy  Hypokalemia Corrected with supplementation  Sleep apnea Continue usual CPAP regimen  Moderate malnutrition in context of acute illness  Family Communication: No family present at time of exam today Disposition: From home - anticipate return home  Objective: Blood pressure 106/61, pulse 71, temperature 98.2 F (36.8 C), temperature source Axillary, resp. rate 20, height 5\' 5"  (1.651 m), weight 85 kg, SpO2 99 %.  Intake/Output Summary (Last 24 hours) at 03/25/2021 0930 Last data  filed at 03/25/2021 0900 Gross per 24 hour  Intake 1647.99 ml  Output --  Net 1647.99 ml    Filed Weights   03/24/21 0205  Weight: 85 kg    Examination: General: No acute respiratory distress Lungs: Clear to auscultation bilaterally without wheezes or crackles Cardiovascular: Regular rate and rhythm without murmur gallop or rub normal S1 and S2 Abdomen: Mildly tender in epigastrium, mildly protuberant, soft, bowel sounds positive, no mass Extremities: No significant cyanosis, clubbing, or edema bilateral lower extremities   CBC: Recent Labs  Lab 03/23/21 2051 03/24/21 0459 03/25/21 0509  WBC 9.4 9.5 5.3  NEUTROABS  --  8.3*  --   HGB 13.7 11.3* 10.4*  HCT 42.9 34.1* 32.3*  MCV 84.1 83.0 85.0  PLT 241 182 149*    Basic Metabolic Panel: Recent Labs  Lab 03/23/21 2051 03/24/21 0459 03/25/21 0509  NA 137 133* 134*  K 3.2* 3.5 3.6  CL 98 97* 102  CO2 26 27 26   GLUCOSE 196* 184* 155*  BUN 16 13 10   CREATININE 0.74 0.55 0.63  CALCIUM 9.7 8.8* 8.6*  MG  --   --  1.9  PHOS  --   --  3.6    GFR: Estimated Creatinine Clearance: 68.4 mL/min (by C-G formula based on SCr of 0.63 mg/dL).  Liver Function Tests: Recent Labs  Lab 03/18/21 2019 03/23/21 2051 03/24/21 0459 03/25/21 0509  AST 28 105* 160* 94*  ALT 27 148* 143* 126*  ALKPHOS 70 299* 279* 303*  BILITOT 0.8 3.6* 3.6* 3.0*  PROT 7.6 8.2* 6.5 6.3*  ALBUMIN 4.0 4.1 3.0* 2.8*  Recent Labs  Lab 03/18/21 2019 03/23/21 2051 03/25/21 0509  LIPASE 55* 1,016* 46     CBG: Recent Labs  Lab 03/24/21 1638 03/24/21 2051 03/24/21 2305 03/25/21 0329 03/25/21 0732  GLUCAP 137* 161* 140* 145* 144*     Scheduled Meds:  amphetamine-dextroamphetamine  20 mg Oral Daily   ARIPiprazole  5 mg Oral QHS   insulin aspart  0-9 Units Subcutaneous Q4H   Continuous Infusions:  dextrose 5 % and 0.9 % NaCl with KCl 20 mEq/L 75 mL/hr at 03/25/21 0102     LOS: 1 day   Lonia Blood, MD [facility] Office  [phone] Pager - Text Page per Loretha Stapler  If 7PM-7AM, please contact night-coverage per Amion 03/25/2021, 9:30 AM

## 2021-03-25 NOTE — H&P (View-Only) (Signed)
[facility] Progress Note ° °Sabrina Arroyo 70 y.o. 06/11/1950 ° °CC: Choledocholithiasis ° ° °Subjective: °Patient states she is still having epigastric pain.  Denies nausea and vomiting.  Would like to try to eat if she can. ° °ROS : Review of Systems  °Gastrointestinal:  Positive for abdominal pain. Negative for blood in stool, constipation, diarrhea, heartburn, melena, nausea and vomiting.  °Genitourinary:  Negative for dysuria and urgency.   ° ° °Objective: °Vital signs in last 24 hours: °Vitals:  ° 03/24/21 2049 03/25/21 0328  °BP: 115/72 106/61  °Pulse: 62 71  °Resp: 20 20  °Temp: 98.9 °F (37.2 °C) 98.2 °F (36.8 °C)  °SpO2: 92% 99%  ° ° °Physical Exam: ° °General:  Alert, cooperative, no distress, obese  °Head:  Normocephalic, without obvious abnormality, atraumatic  °Eyes:  Anicteric sclera, EOM's intact  °Lungs:   Clear to auscultation bilaterally, respirations unlabored  °Heart:  Regular rate and rhythm, S1, S2 normal  °Abdomen:   Soft, epigastric tenderness, bowel sounds active all four quadrants,  no masses,   ° ° °Lab Results: °Recent Labs  °  03/24/21 °0459 03/25/21 °0509  °NA 133* 134*  °K 3.5 3.6  °CL 97* 102  °CO2 27 26  °GLUCOSE 184* 155*  °BUN 13 10  °CREATININE 0.55 0.63  °CALCIUM 8.8* 8.6*  °MG  --  1.9  °PHOS  --  3.6  ° °Recent Labs  °  03/24/21 °0459 03/25/21 °0509  °AST 160* 94*  °ALT 143* 126*  °ALKPHOS 279* 303*  °BILITOT 3.6* 3.0*  °PROT 6.5 6.3*  °ALBUMIN 3.0* 2.8*  ° °Recent Labs  °  03/24/21 °0459 03/25/21 °0509  °WBC 9.5 5.3  °NEUTROABS 8.3*  --   °HGB 11.3* 10.4*  °HCT 34.1* 32.3*  °MCV 83.0 85.0  °PLT 182 149*  ° °Recent Labs  °  03/25/21 °0509  °LABPROT 13.5  °INR 1.0  ° ° ° ° °Assessment °Choledocholithiasis °-Lipase 1016 °-AST 94/ALT 126/alk phos 303 °-T bili 3.0 °- No leukocytosis °-Normal renal function °-Abdominal ultrasound 1/22: cholelithiasis without cholecystitis.  Dilation of the extrahepatic bile duct.  Distal obstructing lesion. °- MRCP 1/23: Cholelithiasis,  diffuse gallbladder wall thickening (cholecystitis cannot be excluded).  5 mm calculus in distal common bile duct.  Common bile duct measuring 6 mm.  Pancreas divisum °  °Plan: °Plan for ERCP tomorrow. I thoroughly discussed procedure with the patient to include nature, alternatives, benefits, and risks (including but not limited to post ERCP pancreatitis, bleeding, infection, perforation, anesthesia/cardiac pulmonary complications).  Patient verbalized understanding and gave verbal consent to proceed with ERCP. °Continue to trend LFTs °Can have clear liquids as tolerated °After ERCP, patient will benefit from surgical consult for consideration for cholecystectomy. °[facility] will follow ° °Kaian Fahs M Olliver Boyadjian PA-C °03/25/2021, 11:22 AM ° °Contact #  [phone]  °

## 2021-03-26 ENCOUNTER — Encounter (HOSPITAL_COMMUNITY): Payer: Self-pay | Admitting: Internal Medicine

## 2021-03-26 ENCOUNTER — Encounter (HOSPITAL_COMMUNITY)
Admission: EM | Disposition: A | Discharge status: Home / Self Care | Payer: Self-pay | Source: Home / Self Care | Attending: Internal Medicine

## 2021-03-26 ENCOUNTER — Inpatient Hospital Stay (HOSPITAL_COMMUNITY): Payer: Medicare Other

## 2021-03-26 ENCOUNTER — Inpatient Hospital Stay (HOSPITAL_COMMUNITY): Payer: Medicare Other | Admitting: Certified Registered Nurse Anesthetist

## 2021-03-26 HISTORY — PX: ENDOSCOPIC RETROGRADE CHOLANGIOPANCREATOGRAPHY (ERCP) WITH PROPOFOL: SHX5810

## 2021-03-26 HISTORY — PX: SPHINCTEROTOMY: SHX5279

## 2021-03-26 HISTORY — PX: REMOVAL OF STONES: SHX5545

## 2021-03-26 LAB — CBC
HCT: 33.2 % — ABNORMAL LOW (ref 36.0–46.0)
Hemoglobin: 10.6 g/dL — ABNORMAL LOW (ref 12.0–15.0)
MCH: 27.3 pg (ref 26.0–34.0)
MCHC: 31.9 g/dL (ref 30.0–36.0)
MCV: 85.6 fL (ref 80.0–100.0)
Platelets: 179 10*3/uL (ref 150–400)
RBC: 3.88 MIL/uL (ref 3.87–5.11)
RDW: 15 % (ref 11.5–15.5)
WBC: 6.7 10*3/uL (ref 4.0–10.5)
nRBC: 0 % (ref 0.0–0.2)

## 2021-03-26 LAB — GLUCOSE, CAPILLARY
Glucose-Capillary: 160 mg/dL — ABNORMAL HIGH (ref 70–99)
Glucose-Capillary: 136 mg/dL — ABNORMAL HIGH (ref 70–99)
Glucose-Capillary: 141 mg/dL — ABNORMAL HIGH (ref 70–99)
Glucose-Capillary: 141 mg/dL — ABNORMAL HIGH (ref 70–99)
Glucose-Capillary: 136 mg/dL — ABNORMAL HIGH (ref 70–99)

## 2021-03-26 LAB — COMPREHENSIVE METABOLIC PANEL
ALT: 91 U/L — ABNORMAL HIGH (ref 0–44)
AST: 42 U/L — ABNORMAL HIGH (ref 15–41)
Albumin: 3 g/dL — ABNORMAL LOW (ref 3.5–5.0)
Alkaline Phosphatase: 288 U/L — ABNORMAL HIGH (ref 38–126)
Anion gap: 8 (ref 5–15)
BUN: 8 mg/dL (ref 8–23)
CO2: 25 mmol/L (ref 22–32)
Calcium: 8.8 mg/dL — ABNORMAL LOW (ref 8.9–10.3)
Chloride: 104 mmol/L (ref 98–111)
Creatinine, Ser: 0.59 mg/dL (ref 0.44–1.00)
GFR, Estimated: >60 mL/min (ref >60)
Glucose, Bld: 168 mg/dL — ABNORMAL HIGH (ref 70–99)
Potassium: 3.6 mmol/L (ref 3.5–5.1)
Sodium: 137 mmol/L (ref 135–145)
Total Bilirubin: 1.8 mg/dL — ABNORMAL HIGH (ref 0.3–1.2)
Total Protein: 6.5 g/dL (ref 6.5–8.1)

## 2021-03-26 SURGERY — ENDOSCOPIC RETROGRADE CHOLANGIOPANCREATOGRAPHY (ERCP) WITH PROPOFOL
Anesthesia: General

## 2021-03-26 MED ORDER — PHENYLEPHRINE HCL-NACL 20-0.9 MG/250ML-% IV SOLN
INTRAVENOUS | Status: DC | PRN
Start: 2021-03-26 — End: 2021-03-26
  Administered 2021-03-26: 50 ug/min via INTRAVENOUS

## 2021-03-26 MED ORDER — FENTANYL CITRATE (PF) 100 MCG/2ML IJ SOLN
INTRAMUSCULAR | Status: AC
  Filled 2021-03-26: qty 2

## 2021-03-26 MED ORDER — FENTANYL CITRATE (PF) 100 MCG/2ML IJ SOLN
INTRAMUSCULAR | Status: DC | PRN
  Administered 2021-03-26: 100 ug via INTRAVENOUS

## 2021-03-26 MED ORDER — ONDANSETRON HCL 4 MG/2ML IJ SOLN
INTRAMUSCULAR | Status: DC | PRN
  Administered 2021-03-26: 4 mg via INTRAVENOUS

## 2021-03-26 MED ORDER — CIPROFLOXACIN IN D5W 400 MG/200ML IV SOLN
INTRAVENOUS | Status: DC | PRN
  Administered 2021-03-26: 400 mg via INTRAVENOUS

## 2021-03-26 MED ORDER — ROCURONIUM BROMIDE 10 MG/ML (PF) SYRINGE
PREFILLED_SYRINGE | INTRAVENOUS | Status: DC | PRN
Start: 2021-03-26 — End: 2021-03-26
  Administered 2021-03-26: 50 mg via INTRAVENOUS

## 2021-03-26 MED ORDER — CIPROFLOXACIN IN D5W 400 MG/200ML IV SOLN
INTRAVENOUS | Status: AC
  Filled 2021-03-26: qty 200

## 2021-03-26 MED ORDER — SUGAMMADEX SODIUM 200 MG/2ML IV SOLN
INTRAVENOUS | Status: DC | PRN
Start: 2021-03-26 — End: 2021-03-26
  Administered 2021-03-26: 200 mg via INTRAVENOUS

## 2021-03-26 MED ORDER — SODIUM CHLORIDE 0.9 % IV SOLN
INTRAVENOUS | Status: DC | PRN
  Administered 2021-03-26: 14:00:00 30 mL

## 2021-03-26 MED ORDER — PROPOFOL 10 MG/ML IV BOLUS
INTRAVENOUS | Status: AC
  Filled 2021-03-26: qty 20

## 2021-03-26 MED ORDER — PROPOFOL 10 MG/ML IV BOLUS
INTRAVENOUS | Status: DC | PRN
  Administered 2021-03-26: 150 mg via INTRAVENOUS

## 2021-03-26 MED ORDER — EPHEDRINE SULFATE-NACL 50-0.9 MG/10ML-% IV SOSY
PREFILLED_SYRINGE | INTRAVENOUS | Status: DC | PRN
  Administered 2021-03-26: 10 mg via INTRAVENOUS

## 2021-03-26 MED ORDER — LIDOCAINE 2% (20 MG/ML) 5 ML SYRINGE
INTRAMUSCULAR | Status: DC | PRN
Start: 2021-03-26 — End: 2021-03-26
  Administered 2021-03-26: 100 mg via INTRAVENOUS

## 2021-03-26 MED ORDER — GLUCAGON HCL RDNA (DIAGNOSTIC) 1 MG IJ SOLR
INTRAMUSCULAR | Status: AC
  Filled 2021-03-26: qty 1

## 2021-03-26 MED ORDER — INDOMETHACIN 50 MG RE SUPP
RECTAL | Status: DC | PRN
  Administered 2021-03-26: 100 mg via RECTAL

## 2021-03-26 MED ORDER — PHENYLEPHRINE 40 MCG/ML (10ML) SYRINGE FOR IV PUSH (FOR BLOOD PRESSURE SUPPORT)
PREFILLED_SYRINGE | INTRAVENOUS | Status: DC | PRN
  Administered 2021-03-26 (×2): 120 ug via INTRAVENOUS

## 2021-03-26 MED ORDER — INDOMETHACIN 50 MG RE SUPP
RECTAL | Status: AC
  Filled 2021-03-26: qty 2

## 2021-03-26 MED ORDER — LACTATED RINGERS IV SOLN
INTRAVENOUS | Status: DC
  Administered 2021-03-26: 13:00:00 1000 mL via INTRAVENOUS

## 2021-03-26 NOTE — Anesthesia Postprocedure Evaluation (Signed)
Anesthesia Post Note  Patient: Arwen Haseley  Procedure(s) Performed: ENDOSCOPIC RETROGRADE CHOLANGIOPANCREATOGRAPHY (ERCP) WITH PROPOFOL SPHINCTEROTOMY REMOVAL OF STONES     Patient location during evaluation: PACU Anesthesia Type: General Level of consciousness: awake and alert and oriented Pain management: pain level controlled Vital Signs Assessment: post-procedure vital signs reviewed and stable Respiratory status: spontaneous breathing, nonlabored ventilation and respiratory function stable Cardiovascular status: blood pressure returned to baseline Postop Assessment: no apparent nausea or vomiting Anesthetic complications: no   No notable events documented.  Last Vitals:  Vitals:   03/26/21 1432 03/26/21 1440  BP: 118/63 117/63  Pulse: (!) 55   Resp: 13   Temp:    SpO2: 98%     Last Pain:  Vitals:   03/26/21 1440  TempSrc:   PainSc: 0-No pain                 Shanda Howells

## 2021-03-26 NOTE — Anesthesia Preprocedure Evaluation (Addendum)
Anesthesia Evaluation  Patient identified by MRN, date of birth, ID band Patient awake    Reviewed: Allergy & Precautions, NPO status , Patient's Chart, lab work & pertinent test results  History of Anesthesia Complications Negative for: history of anesthetic complications  Airway Mallampati: II  TM Distance: >3 FB Neck ROM: Full    Dental no notable dental hx.    Pulmonary sleep apnea ,    Pulmonary exam normal        Cardiovascular negative cardio ROS Normal cardiovascular exam     Neuro/Psych Depression negative neurological ROS     GI/Hepatic Neg liver ROS, GERD  Medicated and Controlled,distal CBD stone   Endo/Other  diabetes, Type 2, Oral Hypoglycemic Agents  Renal/GU negative Renal ROS  negative genitourinary   Musculoskeletal  (+) Arthritis ,   Abdominal   Peds  Hematology negative hematology ROS (+)   Anesthesia Other Findings Day of surgery medications reviewed with patient.  Reproductive/Obstetrics negative OB ROS                            Anesthesia Physical Anesthesia Plan  ASA: 2  Anesthesia Plan: General   Post-op Pain Management: Minimal or no pain anticipated   Induction: Intravenous  PONV Risk Score and Plan: 3 and Treatment may vary due to age or medical condition and Ondansetron  Airway Management Planned: Oral ETT  Additional Equipment: None  Intra-op Plan:   Post-operative Plan: Extubation in OR  Informed Consent: I have reviewed the patients History and Physical, chart, labs and discussed the procedure including the risks, benefits and alternatives for the proposed anesthesia with the patient or authorized representative who has indicated his/her understanding and acceptance.     Dental advisory given  Plan Discussed with: CRNA  Anesthesia Plan Comments:        Anesthesia Quick Evaluation

## 2021-03-26 NOTE — Transfer of Care (Signed)
Immediate Anesthesia Transfer of Care Note  Patient: Sabrina Arroyo  Procedure(s) Performed: ENDOSCOPIC RETROGRADE CHOLANGIOPANCREATOGRAPHY (ERCP) WITH PROPOFOL SPHINCTEROTOMY REMOVAL OF STONES  Patient Location: PACU and Endoscopy Unit  Anesthesia Type:General  Level of Consciousness: awake, alert  and oriented  Airway & Oxygen Therapy: Patient Spontanous Breathing and Patient connected to face mask oxygen  Post-op Assessment: Report given to RN and Post -op Vital signs reviewed and stable  Post vital signs: Reviewed and stable  Last Vitals:  Vitals Value Taken Time  BP 130/61 03/26/21 1406  Temp    Pulse 62 03/26/21 1407  Resp 14 03/26/21 1407  SpO2 100 % 03/26/21 1407  Vitals shown include unvalidated device data.  Last Pain:  Vitals:   03/26/21 1302  TempSrc: Oral  PainSc: 0-No pain      Patients Stated Pain Goal: 0 (03/24/21 2050)  Complications: No notable events documented.

## 2021-03-26 NOTE — Progress Notes (Signed)
°   03/26/21 1200  Mobility  Activity Ambulated with assistance in hallway  Level of Assistance Modified independent, requires aide device or extra time  Assistive Device Other (Comment) (IV pole)  Distance Ambulated (ft) 350 ft  Activity Response Tolerated well  $Mobility charge 1 Mobility   Pt agreeable to mobilize this morning. Ambulated about 362ft in hall with IV pole, tolerated well. No complaints. Did ask if it was possible for a PT order to be placed for her left knee. Stated that she is supposed to be going tomorrow, but since she is having a procedure today she stated her appointment was cancelled. However, she would like to have PT work with her so she does not fall behind on healing. Left pt in bathroom brushing teeth.   [place] Special[facility] Acute Rehab Services Office: [phone]

## 2021-03-26 NOTE — Interval H&P Note (Signed)
History and Physical Interval Note: 70/female with CBD stone on MRCP and gallstone pancreatitis for an ERCP.  03/26/2021 1:12 PM  Sabrina Arroyo  has presented today for ERCP, with the diagnosis of distal CBD stone.  The various methods of treatment have been discussed with the patient and family. After consideration of risks, benefits and other options for treatment, the patient has consented to  Procedure(s): ENDOSCOPIC RETROGRADE CHOLANGIOPANCREATOGRAPHY (ERCP) WITH PROPOFOL (N/A) as a surgical intervention.  The patient's history has been reviewed, patient examined, no change in status, stable for surgery.  I have reviewed the patient's chart and labs.  Questions were answered to the patient's satisfaction.     Sabrina Arroyo

## 2021-03-26 NOTE — Progress Notes (Signed)
PROGRESS NOTE    Sabrina Arroyo  [id number] DOB: [birth year] DOA: 03/23/2021 PCP: Deeann Cree, MD   Brief Narrative:71yo with a history of DM2, depression, HLD, and sleep apnea who presented to the ED with epigastric abdominal pain which radiated through to the back described as sharp and stabbing in nature associated with nausea and vomiting.  There was an abrupt onset of the symptoms and they persisted for approximately 24 hours.  In the ER she was found to have a lipase of 1000 with mildly elevated transaminases and a total bilirubin of 3.6.  Ultrasound abdomen noted gallbladder stones without acute cholecystitis but dilated extrahepatic biliary ducts.    Assessment & Plan:   Principal Problem:   Abdominal pain Active Problems:   Gallstones   Elevated LFTs   Diabetes mellitus type 2 in obese (HCC)   Malnutrition of moderate degree   #1 acute pancreatitis secondary to gallstones MRCP with common bile duct stone.  She is status post ERCP 03/26/2021.  General surgery consulted for possible cholecystectomy. ERCP Dr. Therisa Doyne 03/26/2021 with entire main bile duct was mildly dilated.  Choledocholithiasis found.  Complete removal was accomplished by biliary sphincterotomy and balloon extraction.  #2 type 2 diabetes continue SSI.  On metformin at home  #3 hyperlipidemia statins on hold on Crestor at home  #4 hypokalemia repleted recheck labs in a.m.  #5 sleep apnea on CPAP at night  #6 depression on Abilify and Adderall.   Nutrition Problem: Moderate Malnutrition Etiology: acute illness     Signs/Symptoms: percent weight loss, energy intake < 75% for > 7 days    Interventions: Premier Protein, MVI  Estimated body mass index is 31.12 kg/m as calculated from the following:   Height as of this encounter: 5\' 5"  (1.651 m).   Weight as of this encounter: 84.8 kg.  DVT prophylaxis: None Code Status: Full code Family Communication: None at bedside  disposition  Plan:  Status is: Inpatient  Remains inpatient appropriate because: Acute pancreatitis secondary to gallstones for cholecystectomy status post ERCP Consultants:  GI and general surgery  Procedures: ERCP 03/26/2021 Antimicrobials:  Anti-infectives (From admission, onward)    None       Subjective: Patient resting in bed anxious to have the procedure done Continues to complain of abdominal pain  Objective: Vitals:   03/25/21 1953 03/26/21 0405 03/26/21 1302 03/26/21 1406  BP: 133/76 111/65 117/72 130/61  Pulse: 70 63 (!) 57 61  Resp: 16 16 17 13   Temp: 98.7 F (37.1 C) 98.4 F (36.9 C) 98.2 F (36.8 C) 97.9 F (36.6 C)  TempSrc: Oral Oral Oral Temporal  SpO2: 99% 97% 91%   Weight:   84.8 kg   Height:   5\' 5"  (1.651 m)     Intake/Output Summary (Last 24 hours) at 03/26/2021 1418 Last data filed at 03/26/2021 1407 Gross per 24 hour  Intake 1310 ml  Output --  Net 1310 ml   Filed Weights   03/24/21 0205 03/26/21 1302  Weight: 85 kg 84.8 kg    Examination:  General exam: Appears calm and comfortable  Respiratory system: Clear to auscultation. Respiratory effort normal. Cardiovascular system: S1 & S2 heard, RRR. No JVD, murmurs, rubs, gallops or clicks. No pedal edema. Gastrointestinal system: Abdomen is nondistended, soft and tender. No organomegaly or masses felt. Normal bowel sounds heard. Central nervous system: Alert and oriented. No focal neurological deficits. Extremities: Symmetric 5 x 5 power. Skin: No rashes, lesions or ulcers Psychiatry: Judgement and insight appear  normal. Mood & affect appropriate.     Data Reviewed: I have personally reviewed following labs and imaging studies  CBC: Recent Labs  Lab 03/23/21 2051 03/24/21 0459 03/25/21 0509 03/26/21 0455  WBC 9.4 9.5 5.3 6.7  NEUTROABS  --  8.3*  --   --   HGB 13.7 11.3* 10.4* 10.6*  HCT 42.9 34.1* 32.3* 33.2*  MCV 84.1 83.0 85.0 85.6  PLT 241 182 149* 0000000   Basic Metabolic  Panel: Recent Labs  Lab 03/23/21 2051 03/24/21 0459 03/25/21 0509 03/26/21 0455  NA 137 133* 134* 137  K 3.2* 3.5 3.6 3.6  CL 98 97* 102 104  CO2 26 27 26 25   GLUCOSE 196* 184* 155* 168*  BUN 16 13 10 8   CREATININE 0.74 0.55 0.63 0.59  CALCIUM 9.7 8.8* 8.6* 8.8*  MG  --   --  1.9  --   PHOS  --   --  3.6  --    GFR: Estimated Creatinine Clearance: 68.3 mL/min (by C-G formula based on SCr of 0.59 mg/dL). Liver Function Tests: Recent Labs  Lab 03/23/21 2051 03/24/21 0459 03/25/21 0509 03/26/21 0455  AST 105* 160* 94* 42*  ALT 148* 143* 126* 91*  ALKPHOS 299* 279* 303* 288*  BILITOT 3.6* 3.6* 3.0* 1.8*  PROT 8.2* 6.5 6.3* 6.5  ALBUMIN 4.1 3.0* 2.8* 3.0*   Recent Labs  Lab 03/23/21 2051 03/25/21 0509  LIPASE 1,016* 46   No results for input(s): AMMONIA in the last 168 hours. Coagulation Profile: Recent Labs  Lab 03/25/21 0509  INR 1.0   Cardiac Enzymes: No results for input(s): CKTOTAL, CKMB, CKMBINDEX, TROPONINI in the last 168 hours. BNP (last 3 results) No results for input(s): PROBNP in the last 8760 hours. HbA1C: No results for input(s): HGBA1C in the last 72 hours. CBG: Recent Labs  Lab 03/25/21 1950 03/25/21 2333 03/26/21 0403 03/26/21 0759 03/26/21 1147  GLUCAP 181* 120* 160* 136* 141*   Lipid Profile: No results for input(s): CHOL, HDL, LDLCALC, TRIG, CHOLHDL, LDLDIRECT in the last 72 hours. Thyroid Function Tests: No results for input(s): TSH, T4TOTAL, FREET4, T3FREE, THYROIDAB in the last 72 hours. Anemia Panel: No results for input(s): VITAMINB12, FOLATE, FERRITIN, TIBC, IRON, RETICCTPCT in the last 72 hours. Sepsis Labs: No results for input(s): PROCALCITON, LATICACIDVEN in the last 168 hours.  Recent Results (from the past 240 hour(s))  Resp Panel by RT-PCR (Flu A&B, Covid) Nasopharyngeal Swab     Status: None   Collection Time: 03/24/21 12:58 AM   Specimen: Nasopharyngeal Swab; Nasopharyngeal(NP) swabs in vial transport medium   Result Value Ref Range Status   SARS Coronavirus 2 by RT PCR NEGATIVE NEGATIVE Final    Comment: (NOTE) SARS-CoV-2 target nucleic acids are NOT DETECTED.  The SARS-CoV-2 RNA is generally detectable in upper respiratory specimens during the acute phase of infection. The lowest concentration of SARS-CoV-2 viral copies this assay can detect is 138 copies/mL. A negative result does not preclude SARS-Cov-2 infection and should not be used as the sole basis for treatment or other patient management decisions. A negative result may occur with  improper specimen collection/handling, submission of specimen other than nasopharyngeal swab, presence of viral mutation(s) within the areas targeted by this assay, and inadequate number of viral copies(<138 copies/mL). A negative result must be combined with clinical observations, patient history, and epidemiological information. The expected result is Negative.  Fact Sheet for Patients:  EntrepreneurPulse.com.au  Fact Sheet for Healthcare Providers:  IncredibleEmployment.be  This test  is no t yet approved or cleared by the [place] and  has been authorized for detection and/or diagnosis of SARS-CoV-2 by FDA under an Emergency Use Authorization (EUA). This EUA will remain  in effect (meaning this test can be used) for the duration of the COVID-19 declaration under Section 564(b)(1) of the Act, 21 U.S.C.section 360bbb-3(b)(1), unless the authorization is terminated  or revoked sooner.       Influenza A by PCR NEGATIVE NEGATIVE Final   Influenza B by PCR NEGATIVE NEGATIVE Final    Comment: (NOTE) The Xpert Xpress SARS-CoV-2/FLU/RSV plus assay is intended as an aid in the diagnosis of influenza from Nasopharyngeal swab specimens and should not be used as a sole basis for treatment. Nasal washings and aspirates are unacceptable for Xpert Xpress SARS-CoV-2/FLU/RSV testing.  Fact Sheet for  Patients: EntrepreneurPulse.com.au  Fact Sheet for Healthcare Providers: IncredibleEmployment.be  This test is not yet approved or cleared by the [place] FDA and has been authorized for detection and/or diagnosis of SARS-CoV-2 by FDA under an Emergency Use Authorization (EUA). This EUA will remain in effect (meaning this test can be used) for the duration of the COVID-19 declaration under Section 564(b)(1) of the Act, 21 U.S.C. section 360bbb-3(b)(1), unless the authorization is terminated or revoked.  Performed at [facility], [place] [address], [place], [place] [address]          Radiology Studies: No results found.      Scheduled Meds:  [MAR Hold] amphetamine-dextroamphetamine  20 mg Oral Daily   [MAR Hold] ARIPiprazole  5 mg Oral QHS   [MAR Hold] insulin aspart  0-9 Units Subcutaneous Q4H   [MAR Hold] pantoprazole  40 mg Oral Daily   [MAR Hold] pramipexole  0.5 mg Oral QHS   [MAR Hold] vortioxetine HBr  20 mg Oral QHS   Continuous Infusions:  dextrose 5 % and 0.9 % NaCl with KCl 20 mEq/L 75 mL/hr at 03/25/21 1305   lactated ringers 20 mL/hr at 03/26/21 1316     LOS: 2 days   Georgette Shell, MD 03/26/2021, 2:18 PM

## 2021-03-26 NOTE — Anesthesia Procedure Notes (Signed)
Procedure Name: Intubation Date/Time: 03/26/2021 1:22 PM Performed by: Maxwell Caul, CRNA Pre-anesthesia Checklist: Patient identified, Emergency Drugs available, Suction available and Patient being monitored Patient Re-evaluated:Patient Re-evaluated prior to induction Oxygen Delivery Method: Circle system utilized Preoxygenation: Pre-oxygenation with 100% oxygen Induction Type: IV induction Ventilation: Mask ventilation without difficulty Laryngoscope Size: Mac and 4 Grade View: Grade I Tube type: Oral Tube size: 7.0 mm Number of attempts: 1 Airway Equipment and Method: Stylet Placement Confirmation: ETT inserted through vocal cords under direct vision, positive ETCO2 and breath sounds checked- equal and bilateral Secured at: 21 cm Tube secured with: Tape Dental Injury: Teeth and Oropharynx as per pre-operative assessment

## 2021-03-26 NOTE — Op Note (Signed)
[facility] Patient Name: Sabrina Arroyo Procedure Date: 03/26/2021 MRN: [id number] Attending MD: Kerin Salen , MD Date of Birth: Feb 28, 1951 CSN: [id number] Age: 71 Admit Type: Outpatient Procedure:                ERCP Indications:              Common bile duct stone(s), Elevated liver enzymes,                            For therapy of acute pancreatitis, Acute                            pancreatitis suspected due to gallstone Providers:                Kerin Salen, MD, Norman Clay, RN, Salley Scarlet,                            Technician, Maricela Curet, CRNA Referring MD:             [facility] Medicines:                Monitored Anesthesia Care Complications:            No immediate complications. Estimated Blood Loss:     Estimated blood loss: none. Procedure:                Pre-Anesthesia Assessment:                           - Prior to the procedure, a History and Physical                            was performed, and patient medications and                            allergies were reviewed. The patient's tolerance of                            previous anesthesia was also reviewed. The risks                            and benefits of the procedure and the sedation                            options and risks were discussed with the patient.                            All questions were answered, and informed consent                            was obtained. Prior Anticoagulants: The patient has                            taken no previous anticoagulant or antiplatelet  agents except for aspirin. ASA Grade Assessment: II                            - A patient with mild systemic disease. After                            reviewing the risks and benefits, the patient was                            deemed in satisfactory condition to undergo the                            procedure.                           After obtaining informed  consent, the scope was                            passed under direct vision. Throughout the                            procedure, the patient's blood pressure, pulse, and                            oxygen saturations were monitored continuously. The                            [id number]-[id number] ([id number]) Olympus duodenoscope was                            introduced through the mouth, and used to inject                            contrast into and used for direct visualization of                            the bile duct. The ERCP was accomplished without                            difficulty. The patient tolerated the procedure                            well. Scope In: Scope Out: Findings:      The scout film was normal. The esophagus was successfully intubated       under direct vision. The scope was advanced to a normal major papilla in       the descending duodenum without detailed examination of the pharynx,       larynx and associated structures, and upper GI tract. The upper GI tract       was grossly normal. The bile duct was readily and deeply cannulated with       the sphincterotome on first attempt. Contrast was injected. I personally       interpreted the bile duct images. There was brisk flow of contrast  through the ducts. Image quality was excellent. Contrast extended to the       main bile duct.      The lower third of the main bile duct contained one stone, which was 6       mm in diameter. The main bile duct was mildly dilated. A straight       Roadrunner wire was passed into the biliary tree.      An 8 mm biliary sphincterotomy was made with a braided traction       (standard) sphincterotome using ERBE electrocautery. There was no       post-sphincterotomy bleeding. The biliary tree was swept with a 9/12 mm       balloon starting at the bifurcation.      One stone was removed. No stones remained.      The pancreatic duct was not canulated or injected.      The patient  was given rectal indomethacin prior to start of procedure. Impression:               - The entire main bile duct was mildly dilated.                           - Choledocholithiasis was found. Complete removal                            was accomplished by biliary sphincterotomy and                            balloon extraction.                           - A biliary sphincterotomy was performed.                           - The biliary tree was swept. Moderate Sedation:      Patient did not receive moderate sedation for this procedure, but       instead received monitored anesthesia care. Recommendation:           - Refer to a surgeon today for cholecystectomy. Procedure Code(s):        --- Professional ---                           [id number], Endoscopic retrograde                            cholangiopancreatography (ERCP); with removal of                            calculi/debris from biliary/pancreatic duct(s)                           43262, Endoscopic retrograde                            cholangiopancreatography (ERCP); with                            sphincterotomy/papillotomy  [id number], Endoscopic catheterization of the biliary                            ductal system, radiological supervision and                            interpretation Diagnosis Code(s):        --- Professional ---                           K80.50, Calculus of bile duct without cholangitis                            or cholecystitis without obstruction                           R74.8, Abnormal levels of other serum enzymes                           K85.90, Acute pancreatitis without necrosis or                            infection, unspecified                           K83.8, Other specified diseases of biliary tract CPT copyright 2019 American [employer]. All rights reserved. The codes documented in this report are preliminary and upon coder review may  be revised to meet current  compliance requirements. Kerin SalenArya Trew Sunde, MD 03/26/2021 1:59:37 PM This report has been signed electronically. Number of Addenda: 0

## 2021-03-26 NOTE — Consult Note (Signed)
[facility] Surgery Consult Note  Toba Claudio 1950/06/26  [id number].    Requesting MD: Jerelene Redden Chief Complaint/Reason for Consult: choledocholithiasis  HPI:  Sabrina Arroyo is a 71yo female PMH DM, HLD, OSA who was admitted to [facility] 03/23/21 with gallstone pancreatitis and choledocholithiasis. States that she has had intermittent RUQ pain over the last month. She developed severe epigastric pain radiating into her back 3 days ago. Associated with nausea and vomiting. The pain was constant and severe so she decided to come to the ED.  In the ED she was found to have elevated LFTs with Tbili 3.6, lipase 1016, and a normal WBC. U/s showed Cholelithiasis without sonographic evidence of acute cholecystitis, dilated common bile duct. MRCP was obtained and showed 5 mm calculus in the distal common bile duct, as well as Mild diffuse gallbladder wall thickening.  Patient was admitted to the medical service. Pancreatitis resolved and she was taken for ERCP today by gastroenterology: Choledocholithiasis was found, complete removal was accomplished by biliary sphincterotomy and balloon extraction. General surgery asked to see for consideration of cholecystectomy.   Abdominal surgical history: hysterectomy/ appendectomy Anticoagulants: none Nonsmoker Denies alcohol or illicit drug use Employment: retired Lives at home alone  Review of Systems  Gastrointestinal:  Positive for abdominal pain, nausea and vomiting.   All systems reviewed and otherwise negative except for as above  History reviewed. No pertinent family history.  Past Medical History:  Diagnosis Date   Arthritis    Depression    Diabetes mellitus without complication (HCC)    Sleep apnea     Past Surgical History:  Procedure Laterality Date   ABDOMINAL HYSTERECTOMY     BREAST SURGERY     does not remember which breast   CLAVICLE SURGERY Left    JOINT REPLACEMENT Right    Knee Replacement   TOTAL KNEE  ARTHROPLASTY Left 12/31/2020   Procedure: TOTAL KNEE ARTHROPLASTY;  Surgeon: Kennedy Bucker, MD;  Location: [facility] ORS;  Service: Orthopedics;  Laterality: Left;    Social History:  reports that she has never smoked. She has never used smokeless tobacco. She reports that she does not currently use alcohol. She reports that she does not use drugs.  Allergies:  Allergies  Allergen Reactions   Codeine Itching and Rash    Medications Prior to Admission  Medication Sig Dispense Refill   amphetamine-dextroamphetamine (ADDERALL XR) 20 MG 24 hr capsule Take 20 mg by mouth daily.     ARIPiprazole (ABILIFY) 5 MG tablet Take 5 mg by mouth at bedtime.     aspirin 81 MG EC tablet Take 81 mg by mouth daily.     metFORMIN (GLUCOPHAGE-XR) 500 MG 24 hr tablet Take 1,000 mg by mouth 2 (two) times daily.     Multiple Vitamins-Minerals (MULTIVITAMIN WITH MINERALS) tablet Take 1 tablet by mouth daily. Woman     omeprazole (PRILOSEC) 20 MG capsule Take 20 mg by mouth at bedtime.     ondansetron (ZOFRAN) 4 MG tablet Take 1 tablet (4 mg total) by mouth daily as needed for nausea or vomiting. 30 tablet 1   pramipexole (MIRAPEX) 0.5 MG tablet Take 0.5 mg by mouth at bedtime.     rosuvastatin (CRESTOR) 40 MG tablet Take 40 mg by mouth at bedtime.     traMADol (ULTRAM) 50 MG tablet Take 1 tablet (50 mg total) by mouth every 6 (six) hours as needed. 30 tablet 0   vortioxetine HBr (TRINTELLIX) 20 MG TABS tablet Take 20 mg by mouth  at bedtime.      Prior to Admission medications   Medication Sig Start Date End Date Taking? Authorizing Provider  amphetamine-dextroamphetamine (ADDERALL XR) 20 MG 24 hr capsule Take 20 mg by mouth daily. 10/25/19  Yes [provider]  ARIPiprazole (ABILIFY) 5 MG tablet Take 5 mg by mouth at bedtime. 11/17/20  Yes [provider]  aspirin 81 MG EC tablet Take 81 mg by mouth daily. 11/11/16  Yes [provider]  metFORMIN (GLUCOPHAGE-XR) 500 MG 24 hr tablet Take  1,000 mg by mouth 2 (two) times daily. 11/27/20  Yes [provider]  Multiple Vitamins-Minerals (MULTIVITAMIN WITH MINERALS) tablet Take 1 tablet by mouth daily. Woman   Yes [provider]  omeprazole (PRILOSEC) 20 MG capsule Take 20 mg by mouth at bedtime. 11/05/20  Yes [provider]  ondansetron (ZOFRAN) 4 MG tablet Take 1 tablet (4 mg total) by mouth daily as needed for nausea or vomiting. 03/19/21 03/19/22 Yes Georga HackingMcHugh, Kelly Rose, MD  pramipexole (MIRAPEX) 0.5 MG tablet Take 0.5 mg by mouth at bedtime. 07/18/20  Yes [provider]  rosuvastatin (CRESTOR) 40 MG tablet Take 40 mg by mouth at bedtime. 11/27/20  Yes [provider]  traMADol (ULTRAM) 50 MG tablet Take 1 tablet (50 mg total) by mouth every 6 (six) hours as needed. 01/02/21  Yes Evon SlackGaines, Thomas C, PA-C  vortioxetine HBr (TRINTELLIX) 20 MG TABS tablet Take 20 mg by mouth at bedtime. 12/03/17  Yes [provider]    Blood pressure 117/63, pulse (!) 55, temperature 97.9 F (36.6 C), temperature source Temporal, resp. rate 13, height 5\' 5"  (1.651 m), weight 84.8 kg, SpO2 98 %. Physical Exam: General: pleasant, WD/WN female who is laying in bed in NAD HEENT: head is normocephalic, atraumatic.  Sclera are noninjected.  Pupils equal and round.  Ears and nose without any masses or lesions.  Mouth is pink and moist. Dentition fair Heart: regular, rate, and rhythm.  Normal s1,s2. No obvious murmurs, gallops, or rubs noted.  Palpable pedal pulses bilaterally  Lungs: CTAB, no wheezes, rhonchi, or rales noted.  Respiratory effort nonlabored Abd: soft, ND, +BS, no masses, hernias, or organomegaly. Mild epigastric TTP without rebound or guarding MS: no BUE/BLE edema, calves soft and nontender Skin: warm and dry with no masses, lesions, or rashes Psych: A&Ox4 with an appropriate affect Neuro: cranial nerves grossly intact, equal strength in BUE/BLE bilaterally, normal speech, thought process  intact  Results for orders placed or performed during the hospital encounter of 03/23/21 (from the past 48 hour(s))  Glucose, capillary     Status: Abnormal   Collection Time: 03/24/21  4:38 PM  Result Value Ref Range   Glucose-Capillary 137 (H) 70 - 99 mg/dL    Comment: Glucose reference range applies only to samples taken after fasting for at least 8 hours.   Comment 1 Notify RN   Glucose, capillary     Status: Abnormal   Collection Time: 03/24/21  8:51 PM  Result Value Ref Range   Glucose-Capillary 161 (H) 70 - 99 mg/dL    Comment: Glucose reference range applies only to samples taken after fasting for at least 8 hours.  Glucose, capillary     Status: Abnormal   Collection Time: 03/24/21 11:05 PM  Result Value Ref Range   Glucose-Capillary 140 (H) 70 - 99 mg/dL    Comment: Glucose reference range applies only to samples taken after fasting for at least 8 hours.  Glucose, capillary  Status: Abnormal   Collection Time: 03/25/21  3:29 AM  Result Value Ref Range   Glucose-Capillary 145 (H) 70 - 99 mg/dL    Comment: Glucose reference range applies only to samples taken after fasting for at least 8 hours.  Comprehensive metabolic panel     Status: Abnormal   Collection Time: 03/25/21  5:09 AM  Result Value Ref Range   Sodium 134 (L) 135 - 145 mmol/L   Potassium 3.6 3.5 - 5.1 mmol/L   Chloride 102 98 - 111 mmol/L   CO2 26 22 - 32 mmol/L   Glucose, Bld 155 (H) 70 - 99 mg/dL    Comment: Glucose reference range applies only to samples taken after fasting for at least 8 hours.   BUN 10 8 - 23 mg/dL   Creatinine, Ser 3.74 0.44 - 1.00 mg/dL   Calcium 8.6 (L) 8.9 - 10.3 mg/dL   Total Protein 6.3 (L) 6.5 - 8.1 g/dL   Albumin 2.8 (L) 3.5 - 5.0 g/dL   AST 94 (H) 15 - 41 U/L   ALT 126 (H) 0 - 44 U/L   Alkaline Phosphatase 303 (H) 38 - 126 U/L   Total Bilirubin 3.0 (H) 0.3 - 1.2 mg/dL   GFR, Estimated >82 >70 mL/min    Comment: (NOTE) Calculated using the CKD-EPI Creatinine Equation  (2021)    Anion gap 6 5 - 15    Comment: Performed at [facility], [address] [place], [place] [address]  CBC     Status: Abnormal   Collection Time: 03/25/21  5:09 AM  Result Value Ref Range   WBC 5.3 4.0 - 10.5 K/uL   RBC 3.80 (L) 3.87 - 5.11 MIL/uL   Hemoglobin 10.4 (L) 12.0 - 15.0 g/dL   HCT 44.9 (L) 20.1 - 00.7 %   MCV 85.0 80.0 - 100.0 fL   MCH 27.4 26.0 - 34.0 pg   MCHC 32.2 30.0 - 36.0 g/dL   RDW 12.1 97.5 - 88.3 %   Platelets 149 (L) 150 - 400 K/uL   nRBC 0.0 0.0 - 0.2 %    Comment: Performed at [facility], [address] [place], [place] [address]  Magnesium     Status: None   Collection Time: 03/25/21  5:09 AM  Result Value Ref Range   Magnesium 1.9 1.7 - 2.4 mg/dL    Comment: Performed at [facility], [address], [place], [place] [address]  Phosphorus     Status: None   Collection Time: 03/25/21  5:09 AM  Result Value Ref Range   Phosphorus 3.6 2.5 - 4.6 mg/dL    Comment: Performed at [facility], [address] [place], [place] [address]  Lipase, blood     Status: None   Collection Time: 03/25/21  5:09 AM  Result Value Ref Range   Lipase 46 11 - 51 U/L    Comment: Performed at [facility], [address] [place], [place] [address]  Protime-INR     Status: None   Collection Time: 03/25/21  5:09 AM  Result Value Ref Range   Prothrombin Time 13.5 11.4 - 15.2 seconds   INR 1.0 0.8 - 1.2    Comment: (NOTE) INR goal varies based on device and disease states. Performed at [facility], [address] [place], [place] [address]   Glucose, capillary     Status: Abnormal   Collection Time: 03/25/21  7:32 AM  Result Value Ref Range   Glucose-Capillary 144 (H) 70 - 99 mg/dL    Comment: Glucose reference range applies only to samples taken after fasting for at least 8 hours.  Glucose, capillary     Status: Abnormal   Collection Time:  03/25/21 11:57 AM  Result Value Ref Range   Glucose-Capillary 211 (H) 70 - 99 mg/dL    Comment: Glucose reference range applies only to samples taken after fasting for at least 8 hours.   Comment 1 Notify RN   Glucose, capillary     Status: None   Collection Time: 03/25/21  4:23 PM  Result Value Ref Range   Glucose-Capillary 95 70 - 99 mg/dL    Comment: Glucose reference range applies only to samples taken after fasting for at least 8 hours.   Comment 1 Notify RN   Glucose, capillary     Status: Abnormal   Collection Time: 03/25/21  7:50 PM  Result Value Ref Range   Glucose-Capillary 181 (H) 70 - 99 mg/dL    Comment: Glucose reference range applies only to samples taken after fasting for at least 8 hours.  Glucose, capillary     Status: Abnormal   Collection Time: 03/25/21 11:33 PM  Result Value Ref Range   Glucose-Capillary 120 (H) 70 - 99 mg/dL    Comment: Glucose reference range applies only to samples taken after fasting for at least 8 hours.  Glucose, capillary     Status: Abnormal   Collection Time: 03/26/21  4:03 AM  Result Value Ref Range   Glucose-Capillary 160 (H) 70 - 99 mg/dL    Comment: Glucose reference range applies only to samples taken after fasting for at least 8 hours.  Comprehensive metabolic panel     Status: Abnormal   Collection Time: 03/26/21  4:55 AM  Result Value Ref Range   Sodium 137 135 - 145 mmol/L   Potassium 3.6 3.5 - 5.1 mmol/L   Chloride 104 98 - 111 mmol/L   CO2 25 22 - 32 mmol/L   Glucose, Bld 168 (H) 70 - 99 mg/dL    Comment: Glucose reference range applies only to samples taken after fasting for at least 8 hours.   BUN 8 8 - 23 mg/dL   Creatinine, Ser 1.61 0.44 - 1.00 mg/dL   Calcium 8.8 (L) 8.9 - 10.3 mg/dL   Total Protein 6.5 6.5 - 8.1 g/dL   Albumin 3.0 (L) 3.5 - 5.0 g/dL   AST 42 (H) 15 - 41 U/L   ALT 91 (H) 0 - 44 U/L   Alkaline Phosphatase 288 (H) 38 - 126 U/L   Total Bilirubin 1.8 (H) 0.3 - 1.2 mg/dL   GFR, Estimated >09 >60  mL/min    Comment: (NOTE) Calculated using the CKD-EPI Creatinine Equation (2021)    Anion gap 8 5 - 15    Comment: Performed at [facility], [address], [place], [place] [address]  CBC     Status: Abnormal   Collection Time: 03/26/21  4:55 AM  Result Value Ref Range   WBC 6.7 4.0 - 10.5 K/uL   RBC 3.88 3.87 - 5.11 MIL/uL   Hemoglobin 10.6 (L) 12.0 - 15.0 g/dL   HCT 81.1 (L) 91.4 - 78.2 %   MCV 85.6 80.0 - 100.0 fL   MCH 27.3 26.0 - 34.0 pg   MCHC 31.9 30.0 - 36.0 g/dL   RDW 95.6 21.3 - 08.6 %   Platelets 179 150 - 400  K/uL   nRBC 0.0 0.0 - 0.2 %    Comment: Performed at [facility], [address] [place], [place] [id number]  Glucose, capillary     Status: Abnormal   Collection Time: 03/26/21  7:59 AM  Result Value Ref Range   Glucose-Capillary 136 (H) 70 - 99 mg/dL    Comment: Glucose reference range applies only to samples taken after fasting for at least 8 hours.  Glucose, capillary     Status: Abnormal   Collection Time: 03/26/21 11:47 AM  Result Value Ref Range   Glucose-Capillary 141 (H) 70 - 99 mg/dL    Comment: Glucose reference range applies only to samples taken after fasting for at least 8 hours.  Glucose, capillary     Status: Abnormal   Collection Time: 03/26/21  3:36 PM  Result Value Ref Range   Glucose-Capillary 141 (H) 70 - 99 mg/dL    Comment: Glucose reference range applies only to samples taken after fasting for at least 8 hours.   DG ERCP WITH SPHINCTEROTOMY  Result Date: 03/26/2021 CLINICAL DATA:  CBD obstruction. EXAM: ERCP COMPARISON:  CT AP, 03/18/2021.  MRCP, 03/24/2021. FLUOROSCOPY TIME:  Fluoroscopy Time:  Not provided Radiation Exposure Index (if provided by the fluoroscopic device): Not provided Number of Acquired Spot Images: Single fluoroscopic cine loop with 7 images. FINDINGS: Multiple, limited oblique planar images of the RIGHT upper quadrant obtained C-arm. Images demonstrating flexible endoscopy,  biliary duct cannulation, sphincterotomy, retrograde cholangiogram and balloon sweep. No biliary ductal dilation. Small distal CBD filling defect is demonstrated. IMPRESSION: Fluoroscopic imaging for ERCP. Distal CBD filling defect, consistent with choledocholith is demonstrated. For complete description of intra procedural findings, please see performing service dictation. Electronically Signed   By: Roanna BanningJon  Mugweru M.D.   On: 03/26/2021 14:59    Anti-infectives (From admission, onward)    None        Assessment/Plan Biliary pancreatitis Choledocholithiasis - Patient was admitted with biliary pancreatitis and choledocholithiasis. She underwent successful ERCP today with biliary sphincterotomy and balloon extraction. We discussed the role of cholecystectomy to prevent this from happening again. Patient agrees and would like to proceed during admission. Will make her NPO after midnight and repeat labwork in the AM. Barring no complications from ERCP we can likely plan for laparoscopic cholecystectomy tomorrow.   ID - none VTE - SCDs, ok for chemical dvt ppx from ccs perspective FEN - IVF, FLD, NPO after MN Foley - none  DM HLD OSA Obesity BMI 31.12  Moderate Medical Decision Making  Franne FortsBrooke A Raynette Arras, [facility] 03/26/2021, 3:46 PM Please see Amion for pager number during day hours 7:00am-4:30pm

## 2021-03-27 ENCOUNTER — Inpatient Hospital Stay (HOSPITAL_COMMUNITY): Payer: Medicare Other | Admitting: Anesthesiology

## 2021-03-27 ENCOUNTER — Encounter (HOSPITAL_COMMUNITY): Payer: Self-pay | Admitting: Gastroenterology

## 2021-03-27 ENCOUNTER — Encounter (HOSPITAL_COMMUNITY)
Admission: EM | Disposition: A | Discharge status: Home / Self Care | Payer: Self-pay | Source: Home / Self Care | Attending: Internal Medicine

## 2021-03-27 HISTORY — PX: CHOLECYSTECTOMY: SHX55

## 2021-03-27 LAB — GLUCOSE, CAPILLARY
Glucose-Capillary: 129 mg/dL — ABNORMAL HIGH (ref 70–99)
Glucose-Capillary: 133 mg/dL — ABNORMAL HIGH (ref 70–99)
Glucose-Capillary: 140 mg/dL — ABNORMAL HIGH (ref 70–99)
Glucose-Capillary: 141 mg/dL — ABNORMAL HIGH (ref 70–99)
Glucose-Capillary: 153 mg/dL — ABNORMAL HIGH (ref 70–99)
Glucose-Capillary: 178 mg/dL — ABNORMAL HIGH (ref 70–99)
Glucose-Capillary: 215 mg/dL — ABNORMAL HIGH (ref 70–99)

## 2021-03-27 LAB — CBC
HCT: 32.7 % — ABNORMAL LOW (ref 36.0–46.0)
Hemoglobin: 10.1 g/dL — ABNORMAL LOW (ref 12.0–15.0)
MCH: 27 pg (ref 26.0–34.0)
MCHC: 30.9 g/dL (ref 30.0–36.0)
MCV: 87.4 fL (ref 80.0–100.0)
Platelets: 180 10*3/uL (ref 150–400)
RBC: 3.74 MIL/uL — ABNORMAL LOW (ref 3.87–5.11)
RDW: 15 % (ref 11.5–15.5)
WBC: 4.4 10*3/uL (ref 4.0–10.5)
nRBC: 0 % (ref 0.0–0.2)

## 2021-03-27 LAB — COMPREHENSIVE METABOLIC PANEL WITH GFR
ALT: 67 U/L — ABNORMAL HIGH (ref 0–44)
AST: 29 U/L (ref 15–41)
Albumin: 2.8 g/dL — ABNORMAL LOW (ref 3.5–5.0)
Alkaline Phosphatase: 255 U/L — ABNORMAL HIGH (ref 38–126)
Anion gap: 6 (ref 5–15)
BUN: 7 mg/dL — ABNORMAL LOW (ref 8–23)
CO2: 26 mmol/L (ref 22–32)
Calcium: 8.8 mg/dL — ABNORMAL LOW (ref 8.9–10.3)
Chloride: 106 mmol/L (ref 98–111)
Creatinine, Ser: 0.7 mg/dL (ref 0.44–1.00)
GFR, Estimated: >60 mL/min
Glucose, Bld: 147 mg/dL — ABNORMAL HIGH (ref 70–99)
Potassium: 3.8 mmol/L (ref 3.5–5.1)
Sodium: 138 mmol/L (ref 135–145)
Total Bilirubin: 1.5 mg/dL — ABNORMAL HIGH (ref 0.3–1.2)
Total Protein: 6 g/dL — ABNORMAL LOW (ref 6.5–8.1)

## 2021-03-27 LAB — LIPASE, BLOOD: Lipase: 46 U/L (ref 11–51)

## 2021-03-27 SURGERY — LAPAROSCOPIC CHOLECYSTECTOMY WITH INTRAOPERATIVE CHOLANGIOGRAM
Anesthesia: General | Site: Abdomen

## 2021-03-27 MED ORDER — SUGAMMADEX SODIUM 200 MG/2ML IV SOLN
INTRAVENOUS | Status: DC | PRN
  Administered 2021-03-27: 200 mg via INTRAVENOUS

## 2021-03-27 MED ORDER — PHENYLEPHRINE 40 MCG/ML (10ML) SYRINGE FOR IV PUSH (FOR BLOOD PRESSURE SUPPORT)
PREFILLED_SYRINGE | INTRAVENOUS | Status: DC | PRN
  Administered 2021-03-27: 160 ug via INTRAVENOUS

## 2021-03-27 MED ORDER — ENSURE MAX PROTEIN PO LIQD
11.0000 [oz_av] | Freq: Two times a day (BID) | ORAL | Status: DC
  Administered 2021-03-27 – 2021-03-28 (×3): 11 [oz_av] via ORAL
  Filled 2021-03-27 (×3): qty 330

## 2021-03-27 MED ORDER — FENTANYL CITRATE (PF) 100 MCG/2ML IJ SOLN
INTRAMUSCULAR | Status: AC
  Filled 2021-03-27: qty 2

## 2021-03-27 MED ORDER — TRAMADOL HCL 50 MG PO TABS
50.0000 mg | ORAL_TABLET | Freq: Four times a day (QID) | ORAL | Status: DC | PRN
  Administered 2021-03-27 (×2): 50 mg via ORAL
  Filled 2021-03-27 (×2): qty 1

## 2021-03-27 MED ORDER — LACTATED RINGERS IV SOLN: INTRAVENOUS | Status: DC

## 2021-03-27 MED ORDER — PROPOFOL 10 MG/ML IV BOLUS
INTRAVENOUS | Status: AC
  Filled 2021-03-27: qty 20

## 2021-03-27 MED ORDER — PROPOFOL 10 MG/ML IV BOLUS
INTRAVENOUS | Status: DC | PRN
Start: 2021-03-27 — End: 2021-03-27
  Administered 2021-03-27: 100 mg via INTRAVENOUS
  Administered 2021-03-27: 20 mg via INTRAVENOUS

## 2021-03-27 MED ORDER — GLYCOPYRROLATE PF 0.2 MG/ML IJ SOSY
PREFILLED_SYRINGE | INTRAMUSCULAR | Status: DC | PRN
  Administered 2021-03-27: .2 mg via INTRAVENOUS

## 2021-03-27 MED ORDER — 0.9 % SODIUM CHLORIDE (POUR BTL) OPTIME
TOPICAL | Status: DC | PRN
  Administered 2021-03-27: 1000 mL

## 2021-03-27 MED ORDER — HYDROMORPHONE HCL 1 MG/ML IJ SOLN
1.0000 mg | INTRAMUSCULAR | Status: DC | PRN
  Administered 2021-03-28: 1 mg via INTRAVENOUS
  Filled 2021-03-27: qty 1

## 2021-03-27 MED ORDER — ACETAMINOPHEN 650 MG RE SUPP: 650.0000 mg | Freq: Four times a day (QID) | RECTAL | Status: DC | PRN

## 2021-03-27 MED ORDER — DEXAMETHASONE SODIUM PHOSPHATE 10 MG/ML IJ SOLN
INTRAMUSCULAR | Status: DC | PRN
  Administered 2021-03-27: 6 mg via INTRAVENOUS

## 2021-03-27 MED ORDER — OXYCODONE HCL 5 MG PO TABS: 5.0000 mg | ORAL_TABLET | ORAL | Status: DC | PRN

## 2021-03-27 MED ORDER — FENTANYL CITRATE PF 50 MCG/ML IJ SOSY: 25.0000 ug | PREFILLED_SYRINGE | INTRAMUSCULAR | Status: DC | PRN

## 2021-03-27 MED ORDER — CEFAZOLIN SODIUM-DEXTROSE 2-3 GM-%(50ML) IV SOLR
INTRAVENOUS | Status: DC | PRN
  Administered 2021-03-27: 2 g via INTRAVENOUS

## 2021-03-27 MED ORDER — DEXAMETHASONE SODIUM PHOSPHATE 10 MG/ML IJ SOLN
INTRAMUSCULAR | Status: AC
  Filled 2021-03-27: qty 1

## 2021-03-27 MED ORDER — LIDOCAINE HCL (PF) 2 % IJ SOLN
INTRAMUSCULAR | Status: AC
  Filled 2021-03-27: qty 5

## 2021-03-27 MED ORDER — HYDRALAZINE HCL 20 MG/ML IJ SOLN
INTRAMUSCULAR | Status: DC | PRN
  Administered 2021-03-27: 2 mg via INTRAVENOUS
  Administered 2021-03-27: 3 mg via INTRAVENOUS

## 2021-03-27 MED ORDER — BUPIVACAINE-EPINEPHRINE 0.5% -1:200000 IJ SOLN
INTRAMUSCULAR | Status: DC | PRN
  Administered 2021-03-27: 30 mL

## 2021-03-27 MED ORDER — ONDANSETRON HCL 4 MG/2ML IJ SOLN: 4.0000 mg | Freq: Four times a day (QID) | INTRAMUSCULAR | Status: DC | PRN

## 2021-03-27 MED ORDER — BUPIVACAINE-EPINEPHRINE (PF) 0.25% -1:200000 IJ SOLN
INTRAMUSCULAR | Status: AC
  Filled 2021-03-27: qty 30

## 2021-03-27 MED ORDER — ONDANSETRON 4 MG PO TBDP
4.0000 mg | ORAL_TABLET | Freq: Four times a day (QID) | ORAL | Status: DC | PRN
  Administered 2021-03-27: 4 mg via ORAL
  Filled 2021-03-27: qty 1

## 2021-03-27 MED ORDER — ONDANSETRON HCL 4 MG/2ML IJ SOLN
INTRAMUSCULAR | Status: AC
  Filled 2021-03-27: qty 2

## 2021-03-27 MED ORDER — ONDANSETRON HCL 4 MG/2ML IJ SOLN
INTRAMUSCULAR | Status: DC | PRN
Start: 2021-03-27 — End: 2021-03-27
  Administered 2021-03-27: 4 mg via INTRAVENOUS

## 2021-03-27 MED ORDER — SODIUM CHLORIDE 0.45 % IV SOLN: INTRAVENOUS | Status: DC

## 2021-03-27 MED ORDER — LIDOCAINE 2% (20 MG/ML) 5 ML SYRINGE
INTRAMUSCULAR | Status: DC | PRN
  Administered 2021-03-27: 80 mg via INTRAVENOUS

## 2021-03-27 MED ORDER — ROCURONIUM BROMIDE 10 MG/ML (PF) SYRINGE
PREFILLED_SYRINGE | INTRAVENOUS | Status: DC | PRN
  Administered 2021-03-27: 80 mg via INTRAVENOUS

## 2021-03-27 MED ORDER — EPHEDRINE SULFATE-NACL 50-0.9 MG/10ML-% IV SOSY
PREFILLED_SYRINGE | INTRAVENOUS | Status: DC | PRN
  Administered 2021-03-27: 5 mg via INTRAVENOUS

## 2021-03-27 MED ORDER — ACETAMINOPHEN 325 MG PO TABS
650.0000 mg | ORAL_TABLET | Freq: Four times a day (QID) | ORAL | Status: DC | PRN
  Administered 2021-03-28: 650 mg via ORAL
  Filled 2021-03-27: qty 2

## 2021-03-27 MED ORDER — ACETAMINOPHEN 500 MG PO TABS
1000.0000 mg | ORAL_TABLET | Freq: Once | ORAL | Status: AC
  Administered 2021-03-27: 1000 mg via ORAL
  Filled 2021-03-27: qty 2

## 2021-03-27 MED ORDER — CEFAZOLIN SODIUM-DEXTROSE 2-4 GM/100ML-% IV SOLN
INTRAVENOUS | Status: AC
  Filled 2021-03-27: qty 100

## 2021-03-27 MED ORDER — ADULT MULTIVITAMIN W/MINERALS CH
1.0000 | ORAL_TABLET | Freq: Every day | ORAL | Status: DC
  Administered 2021-03-27 – 2021-03-28 (×2): 1 via ORAL
  Filled 2021-03-27 (×2): qty 1

## 2021-03-27 MED ORDER — FENTANYL CITRATE (PF) 100 MCG/2ML IJ SOLN
INTRAMUSCULAR | Status: DC | PRN
  Administered 2021-03-27: 100 ug via INTRAVENOUS
  Administered 2021-03-27 (×2): 50 ug via INTRAVENOUS

## 2021-03-27 MED ORDER — LACTATED RINGERS IR SOLN
Status: DC | PRN
  Administered 2021-03-27: 1000 mL

## 2021-03-27 SURGICAL SUPPLY — 37 items
APPLIER CLIP ROT 10 11.4 M/L (STAPLE) ×2
BAG COUNTER SPONGE SURGICOUNT (BAG) IMPLANT
CABLE HIGH FREQUENCY MONO STRZ (ELECTRODE) ×2 IMPLANT
CHLORAPREP W/TINT 26 (MISCELLANEOUS) ×4 IMPLANT
CLIP APPLIE ROT 10 11.4 M/L (STAPLE) ×1 IMPLANT
COVER MAYO STAND STRL (DRAPES) ×2 IMPLANT
COVER SURGICAL LIGHT HANDLE (MISCELLANEOUS) ×2 IMPLANT
DECANTER SPIKE VIAL GLASS SM (MISCELLANEOUS) ×2 IMPLANT
DERMABOND ADVANCED (GAUZE/BANDAGES/DRESSINGS) ×1
DERMABOND ADVANCED .7 DNX12 (GAUZE/BANDAGES/DRESSINGS) IMPLANT
DRAPE C-ARM 42X120 X-RAY (DRAPES) ×2 IMPLANT
ELECT REM PT RETURN 15FT ADLT (MISCELLANEOUS) ×2 IMPLANT
GAUZE SPONGE 2X2 8PLY STRL LF (GAUZE/BANDAGES/DRESSINGS) ×1 IMPLANT
GLOVE SURG SYN 7.5  E (GLOVE) ×2
GLOVE SURG SYN 7.5 E (GLOVE) ×2 IMPLANT
GLOVE SURG SYN 7.5 PF PI (GLOVE) ×2 IMPLANT
GOWN STRL REUS W/TWL XL LVL3 (GOWN DISPOSABLE) ×4 IMPLANT
HEMOSTAT SURGICEL 4X8 (HEMOSTASIS) IMPLANT
IRRIG SUCT STRYKERFLOW 2 WTIP (MISCELLANEOUS) ×2
IRRIGATION SUCT STRKRFLW 2 WTP (MISCELLANEOUS) ×1 IMPLANT
KIT BASIN OR (CUSTOM PROCEDURE TRAY) ×2 IMPLANT
KIT TURNOVER KIT A (KITS) IMPLANT
PENCIL SMOKE EVACUATOR (MISCELLANEOUS) IMPLANT
POUCH SPECIMEN RETRIEVAL 10MM (ENDOMECHANICALS) ×2 IMPLANT
SCISSORS LAP 5X35 DISP (ENDOMECHANICALS) ×2 IMPLANT
SET CHOLANGIOGRAPH MIX (MISCELLANEOUS) ×2 IMPLANT
SET TUBE SMOKE EVAC HIGH FLOW (TUBING) IMPLANT
SLEEVE XCEL OPT CAN 5 100 (ENDOMECHANICALS) ×2 IMPLANT
SPONGE GAUZE 2X2 STER 10/PKG (GAUZE/BANDAGES/DRESSINGS) ×1
STRIP CLOSURE SKIN 1/2X4 (GAUZE/BANDAGES/DRESSINGS) IMPLANT
SUT MNCRL AB 4-0 PS2 18 (SUTURE) ×2 IMPLANT
TOWEL OR 17X26 10 PK STRL BLUE (TOWEL DISPOSABLE) ×2 IMPLANT
TOWEL OR NON WOVEN STRL DISP B (DISPOSABLE) ×2 IMPLANT
TRAY LAPAROSCOPIC (CUSTOM PROCEDURE TRAY) ×2 IMPLANT
TROCAR BLADELESS OPT 5 100 (ENDOMECHANICALS) ×2 IMPLANT
TROCAR XCEL BLUNT TIP 100MML (ENDOMECHANICALS) ×2 IMPLANT
TROCAR XCEL NON-BLD 11X100MML (ENDOMECHANICALS) ×2 IMPLANT

## 2021-03-27 NOTE — Progress Notes (Signed)
PROGRESS NOTE    Sabrina CliffMargaret Arroyo  NFA:[id number] DOB: 11/17/1950 DOA: 03/23/2021 PCP: Allyne GeeGlover, Anne Katherine, MD   Brief Narrative:71yo with a history of DM2, depression, HLD, and sleep apnea who presented to the ED with epigastric abdominal pain which radiated through to the back described as sharp and stabbing in nature associated with nausea and vomiting.  There was an abrupt onset of the symptoms and they persisted for approximately 24 hours.  In the ER she was found to have a lipase of 1000 with mildly elevated transaminases and a total bilirubin of 3.6.  Ultrasound abdomen noted gallbladder stones without acute cholecystitis but dilated extrahepatic biliary ducts.    Assessment & Plan:   Principal Problem:   Abdominal pain Active Problems:   Gallstones   Elevated LFTs   Diabetes mellitus type 2 in obese (HCC)   Malnutrition of moderate degree   #1 acute pancreatitis secondary to gallstones MRCP with common bile duct stone.  She is status post ERCP 03/26/2021.  General surgery consulted for possible cholecystectomy. Status post laparoscopic cholecystectomy Dr. Gerrit FriendsGerkin Monitor her overnight and hopefully DC home in a.m. if she remains stable. ERCP Dr. Marca AnconaKarki 03/26/2021 with entire main bile duct was mildly dilated.  Choledocholithiasis found.  Complete removal was accomplished by biliary sphincterotomy and balloon extraction.   #2 type 2 diabetes continue SSI.  On metformin at home  #3 hyperlipidemia statins on hold on Crestor at home  #4 hypokalemia repleted recheck labs in a.m.  #5 sleep apnea on CPAP at night  #6 depression on Abilify and Adderall.   Nutrition Problem: Moderate Malnutrition Etiology: acute illness     Signs/Symptoms: percent weight loss, energy intake < 75% for > 7 days    Interventions: Premier Protein, MVI  Estimated body mass index is 31.12 kg/m as calculated from the following:   Height as of this encounter: 5\' 5"  (1.651 m).   Weight as of  this encounter: 84.8 kg.  DVT prophylaxis: None Code Status: Full code Family Communication: None at bedside  disposition Plan:  Status is: Inpatient  Remains inpatient appropriate because: Acute pancreatitis secondary to gallstones for cholecystectomy status post ERCP Consultants:  GI and general surgery  Procedures: ERCP 03/26/2021 Antimicrobials:  Anti-infectives (From admission, onward)    None       Subjective: Patient resting in bed anxious to have the procedure done Continues to complain of abdominal pain  Objective: Vitals:   03/26/21 2007 03/27/21 0409 03/27/21 1200 03/27/21 1215  BP: 112/70 (!) 104/59 118/62 109/78  Pulse: (!) 56 (!) 54 75 72  Resp: 16 16 (!) 22 13  Temp: 98.4 F (36.9 C) 98 F (36.7 C) 97.8 F (36.6 C)   TempSrc: Oral Oral    SpO2: 98% 98% 100% 97%  Weight:      Height:        Intake/Output Summary (Last 24 hours) at 03/27/2021 1233 Last data filed at 03/27/2021 1147 Gross per 24 hour  Intake 1800 ml  Output --  Net 1800 ml    Filed Weights   03/24/21 0205 03/26/21 1302  Weight: 85 kg 84.8 kg    Examination:  General exam: Appears calm and comfortable  Respiratory system: Clear to auscultation. Respiratory effort normal. Cardiovascular system: S1 & S2 heard, RRR. No JVD, murmurs, rubs, gallops or clicks. No pedal edema. Gastrointestinal system: Abdomen is nondistended, soft and tender. No organomegaly or masses felt. Normal bowel sounds heard. Central nervous system: Alert and oriented. No focal neurological deficits.  Extremities: Symmetric 5 x 5 power. Skin: No rashes, lesions or ulcers Psychiatry: Judgement and insight appear normal. Mood & affect appropriate.     Data Reviewed: I have personally reviewed following labs and imaging studies  CBC: Recent Labs  Lab 03/23/21 2051 03/24/21 0459 03/25/21 0509 03/26/21 0455 03/27/21 0450  WBC 9.4 9.5 5.3 6.7 4.4  NEUTROABS  --  8.3*  --   --   --   HGB 13.7 11.3* 10.4*  10.6* 10.1*  HCT 42.9 34.1* 32.3* 33.2* 32.7*  MCV 84.1 83.0 85.0 85.6 87.4  PLT 241 182 149* 179 180    Basic Metabolic Panel: Recent Labs  Lab 03/23/21 2051 03/24/21 0459 03/25/21 0509 03/26/21 0455 03/27/21 0450  NA 137 133* 134* 137 138  K 3.2* 3.5 3.6 3.6 3.8  CL 98 97* 102 104 106  CO2 26 27 26 25 26   GLUCOSE 196* 184* 155* 168* 147*  BUN 16 13 10 8  7*  CREATININE 0.74 0.55 0.63 0.59 0.70  CALCIUM 9.7 8.8* 8.6* 8.8* 8.8*  MG  --   --  1.9  --   --   PHOS  --   --  3.6  --   --     GFR: Estimated Creatinine Clearance: 68.3 mL/min (by C-G formula based on SCr of 0.7 mg/dL). Liver Function Tests: Recent Labs  Lab 03/23/21 2051 03/24/21 0459 03/25/21 0509 03/26/21 0455 03/27/21 0450  AST 105* 160* 94* 42* 29  ALT 148* 143* 126* 91* 67*  ALKPHOS 299* 279* 303* 288* 255*  BILITOT 3.6* 3.6* 3.0* 1.8* 1.5*  PROT 8.2* 6.5 6.3* 6.5 6.0*  ALBUMIN 4.1 3.0* 2.8* 3.0* 2.8*    Recent Labs  Lab 03/23/21 2051 03/25/21 0509 03/27/21 0450  LIPASE 1,016* 46 46    No results for input(s): AMMONIA in the last 168 hours. Coagulation Profile: Recent Labs  Lab 03/25/21 0509  INR 1.0    Cardiac Enzymes: No results for input(s): CKTOTAL, CKMB, CKMBINDEX, TROPONINI in the last 168 hours. BNP (last 3 results) No results for input(s): PROBNP in the last 8760 hours. HbA1C: No results for input(s): HGBA1C in the last 72 hours. CBG: Recent Labs  Lab 03/26/21 1536 03/26/21 2006 03/27/21 0026 03/27/21 0410 03/27/21 0744  GLUCAP 141* 136* 141* 133* 140*    Lipid Profile: No results for input(s): CHOL, HDL, LDLCALC, TRIG, CHOLHDL, LDLDIRECT in the last 72 hours. Thyroid Function Tests: No results for input(s): TSH, T4TOTAL, FREET4, T3FREE, THYROIDAB in the last 72 hours. Anemia Panel: No results for input(s): VITAMINB12, FOLATE, FERRITIN, TIBC, IRON, RETICCTPCT in the last 72 hours. Sepsis Labs: No results for input(s): PROCALCITON, LATICACIDVEN in the last 168  hours.  Recent Results (from the past 240 hour(s))  Resp Panel by RT-PCR (Flu A&B, Covid) Nasopharyngeal Swab     Status: None   Collection Time: 03/24/21 12:58 AM   Specimen: Nasopharyngeal Swab; Nasopharyngeal(NP) swabs in vial transport medium  Result Value Ref Range Status   SARS Coronavirus 2 by RT PCR NEGATIVE NEGATIVE Final    Comment: (NOTE) SARS-CoV-2 target nucleic acids are NOT DETECTED.  The SARS-CoV-2 RNA is generally detectable in upper respiratory specimens during the acute phase of infection. The lowest concentration of SARS-CoV-2 viral copies this assay can detect is 138 copies/mL. A negative result does not preclude SARS-Cov-2 infection and should not be used as the sole basis for treatment or other patient management decisions. A negative result may occur with  improper specimen collection/handling, submission of  specimen other than nasopharyngeal swab, presence of viral mutation(s) within the areas targeted by this assay, and inadequate number of viral copies(<138 copies/mL). A negative result must be combined with clinical observations, patient history, and epidemiological information. The expected result is Negative.  Fact Sheet for Patients:  BloggerCourse.com  Fact Sheet for Healthcare Providers:  SeriousBroker.it  This test is no t yet approved or cleared by the [place] FDA and  has been authorized for detection and/or diagnosis of SARS-CoV-2 by FDA under an Emergency Use Authorization (EUA). This EUA will remain  in effect (meaning this test can be used) for the duration of the COVID-19 declaration under Section 564(b)(1) of the Act, 21 U.S.C.section 360bbb-3(b)(1), unless the authorization is terminated  or revoked sooner.       Influenza A by PCR NEGATIVE NEGATIVE Final   Influenza B by PCR NEGATIVE NEGATIVE Final    Comment: (NOTE) The Xpert Xpress SARS-CoV-2/FLU/RSV plus assay is intended  as an aid in the diagnosis of influenza from Nasopharyngeal swab specimens and should not be used as a sole basis for treatment. Nasal washings and aspirates are unacceptable for Xpert Xpress SARS-CoV-2/FLU/RSV testing.  Fact Sheet for Patients: BloggerCourse.com  Fact Sheet for Healthcare Providers: SeriousBroker.it  This test is not yet approved or cleared by the [place] FDA and has been authorized for detection and/or diagnosis of SARS-CoV-2 by FDA under an Emergency Use Authorization (EUA). This EUA will remain in effect (meaning this test can be used) for the duration of the COVID-19 declaration under Section 564(b)(1) of the Act, 21 U.S.C. section 360bbb-3(b)(1), unless the authorization is terminated or revoked.  Performed at [facility], [address], [place], [place] [address]           Radiology Studies: DG ERCP WITH SPHINCTEROTOMY  Result Date: 03/26/2021 CLINICAL DATA:  CBD obstruction. EXAM: ERCP COMPARISON:  CT AP, 03/18/2021.  MRCP, 03/24/2021. FLUOROSCOPY TIME:  Fluoroscopy Time:  Not provided Radiation Exposure Index (if provided by the fluoroscopic device): Not provided Number of Acquired Spot Images: Single fluoroscopic cine loop with 7 images. FINDINGS: Multiple, limited oblique planar images of the RIGHT upper quadrant obtained C-arm. Images demonstrating flexible endoscopy, biliary duct cannulation, sphincterotomy, retrograde cholangiogram and balloon sweep. No biliary ductal dilation. Small distal CBD filling defect is demonstrated. IMPRESSION: Fluoroscopic imaging for ERCP. Distal CBD filling defect, consistent with choledocholith is demonstrated. For complete description of intra procedural findings, please see performing service dictation. Electronically Signed   By: Roanna Banning M.D.   On: 03/26/2021 14:59        Scheduled Meds:  [MAR Hold] amphetamine-dextroamphetamine  20  mg Oral Daily   [MAR Hold] ARIPiprazole  5 mg Oral QHS   [MAR Hold] insulin aspart  0-9 Units Subcutaneous Q4H   [MAR Hold] pantoprazole  40 mg Oral Daily   [MAR Hold] pramipexole  0.5 mg Oral QHS   [MAR Hold] vortioxetine HBr  20 mg Oral QHS   Continuous Infusions:  dextrose 5 % and 0.9 % NaCl with KCl 20 mEq/L Stopped (03/27/21 0907)   lactated ringers 20 mL/hr at 03/27/21 1032     LOS: 3 days   Alwyn Ren, MD 03/27/2021, 12:33 PM

## 2021-03-27 NOTE — Progress Notes (Signed)
Assessment & Plan: Biliary pancreatitis Choledocholithiasis - Patient was admitted with biliary pancreatitis and choledocholithiasis. She underwent successful ERCP yesterday with biliary sphincterotomy and balloon extraction.  - Discussed proceeding with lap chole today.  Discussed procedure, risks, and benefits with patient and daughter via cell phone by face time.  Discussed possibility of open surgery.  They understand and wish to proceed with surgery today. - will notify OR and post case for later this morning   ID - none VTE - SCDs, ok for chemical dvt ppx from ccs perspective FEN - IVF, FLD, NPO after MN Foley - none   DM HLD OSA Obesity BMI 31.12   Moderate Medical Decision Making        Sabrina Gemma, MD       [facility]       Office: [phone]   Chief Complaint: Biliary pancreatitis, cholelithiasis  Subjective: Patient in bed, comfortable.  Daughter on cell phone.  Objective: Vital signs in last 24 hours: Temp:  [97.9 F (36.6 C)-98.4 F (36.9 C)] 98 F (36.7 C) (01/26 0409) Pulse Rate:  [54-61] 54 (01/26 0409) Resp:  [13-17] 16 (01/26 0409) BP: (104-130)/(59-72) 104/59 (01/26 0409) SpO2:  [91 %-98 %] 98 % (01/26 0409) Weight:  [84.8 kg] 84.8 kg (01/25 1302) Last BM Date: 03/25/21  Intake/Output from previous day: 01/25 0701 - 01/26 0700 In: 800 [I.V.:600; IV Piggyback:200] Out: -  Intake/Output this shift: No intake/output data recorded.  Physical Exam: HEENT - sclerae clear, mucous membranes moist Neck - soft Chest - clear bilaterally Cor - RRR Abdomen - soft without distension; minimal epigastric tenderness, no mass Ext - no edema, non-tender Neuro - alert & oriented, no focal deficits  Lab Results:  Recent Labs    03/26/21 0455 03/27/21 0450  WBC 6.7 4.4  HGB 10.6* 10.1*  HCT 33.2* 32.7*  PLT 179 180   BMET Recent Labs    03/26/21 0455 03/27/21 0450  NA 137 138  K 3.6 3.8  CL 104 106  CO2 25 26   GLUCOSE 168* 147*  BUN 8 7*  CREATININE 0.59 0.70  CALCIUM 8.8* 8.8*   PT/INR Recent Labs    03/25/21 0509  LABPROT 13.5  INR 1.0   Comprehensive Metabolic Panel:    Component Value Date/Time   NA 138 03/27/2021 0450   NA 137 03/26/2021 0455   K 3.8 03/27/2021 0450   K 3.6 03/26/2021 0455   CL 106 03/27/2021 0450   CL 104 03/26/2021 0455   CO2 26 03/27/2021 0450   CO2 25 03/26/2021 0455   BUN 7 (L) 03/27/2021 0450   BUN 8 03/26/2021 0455   CREATININE 0.70 03/27/2021 0450   CREATININE 0.59 03/26/2021 0455   GLUCOSE 147 (H) 03/27/2021 0450   GLUCOSE 168 (H) 03/26/2021 0455   CALCIUM 8.8 (L) 03/27/2021 0450   CALCIUM 8.8 (L) 03/26/2021 0455   AST 29 03/27/2021 0450   AST 42 (H) 03/26/2021 0455   ALT 67 (H) 03/27/2021 0450   ALT 91 (H) 03/26/2021 0455   ALKPHOS 255 (H) 03/27/2021 0450   ALKPHOS 288 (H) 03/26/2021 0455   BILITOT 1.5 (H) 03/27/2021 0450   BILITOT 1.8 (H) 03/26/2021 0455   PROT 6.0 (L) 03/27/2021 0450   PROT 6.5 03/26/2021 0455   ALBUMIN 2.8 (L) 03/27/2021 0450   ALBUMIN 3.0 (L) 03/26/2021 0455    Studies/Results: DG ERCP WITH SPHINCTEROTOMY  Result Date: 03/26/2021 CLINICAL DATA:  CBD obstruction. EXAM: ERCP COMPARISON:  CT AP, 03/18/2021.  MRCP, 03/24/2021. FLUOROSCOPY TIME:  Fluoroscopy Time:  Not provided Radiation Exposure Index (if provided by the fluoroscopic device): Not provided Number of Acquired Spot Images: Single fluoroscopic cine loop with 7 images. FINDINGS: Multiple, limited oblique planar images of the RIGHT upper quadrant obtained C-arm. Images demonstrating flexible endoscopy, biliary duct cannulation, sphincterotomy, retrograde cholangiogram and balloon sweep. No biliary ductal dilation. Small distal CBD filling defect is demonstrated. IMPRESSION: Fluoroscopic imaging for ERCP. Distal CBD filling defect, consistent with choledocholith is demonstrated. For complete description of intra procedural findings, please see performing service  dictation. Electronically Signed   By: Michaelle Birks M.D.   On: 03/26/2021 14:59      Sabrina Arroyo 03/27/2021   Patient ID: Dierdre Forth, female   DOB: 1950/12/04, 71 y.o.   MRN: [id number]

## 2021-03-27 NOTE — Anesthesia Procedure Notes (Signed)
Procedure Name: Intubation Date/Time: 03/27/2021 10:57 AM Performed by: Lavina Hamman, CRNA Pre-anesthesia Checklist: Patient identified, Emergency Drugs available, Suction available, Patient being monitored and Timeout performed Patient Re-evaluated:Patient Re-evaluated prior to induction Oxygen Delivery Method: Circle system utilized Preoxygenation: Pre-oxygenation with 100% oxygen Induction Type: IV induction Ventilation: Mask ventilation without difficulty and Oral airway inserted - appropriate to patient size Laryngoscope Size: Mac and 3 Grade View: Grade II Tube type: Oral Tube size: 7.5 mm Number of attempts: 1 Airway Equipment and Method: Stylet Placement Confirmation: ETT inserted through vocal cords under direct vision, positive ETCO2, CO2 detector and breath sounds checked- equal and bilateral Secured at: 22 cm Tube secured with: Tape Dental Injury: Teeth and Oropharynx as per pre-operative assessment  Comments: ATOI

## 2021-03-27 NOTE — H&P (View-Only) (Signed)
Assessment & Plan: Biliary pancreatitis Choledocholithiasis - Patient was admitted with biliary pancreatitis and choledocholithiasis. She underwent successful ERCP yesterday with biliary sphincterotomy and balloon extraction.  - Discussed proceeding with lap chole today.  Discussed procedure, risks, and benefits with patient and daughter via cell phone by face time.  Discussed possibility of open surgery.  They understand and wish to proceed with surgery today. - will notify OR and post case for later this morning   ID - none VTE - SCDs, ok for chemical dvt ppx from ccs perspective FEN - IVF, FLD, NPO after MN Foley - none   DM HLD OSA Obesity BMI 31.12   Moderate Medical Decision Making        Armandina Gemma, MD       [facility]       Office: [phone]   Chief Complaint: Biliary pancreatitis, cholelithiasis  Subjective: Patient in bed, comfortable.  Daughter on cell phone.  Objective: Vital signs in last 24 hours: Temp:  [97.9 F (36.6 C)-98.4 F (36.9 C)] 98 F (36.7 C) (01/26 0409) Pulse Rate:  [54-61] 54 (01/26 0409) Resp:  [13-17] 16 (01/26 0409) BP: (104-130)/(59-72) 104/59 (01/26 0409) SpO2:  [91 %-98 %] 98 % (01/26 0409) Weight:  [84.8 kg] 84.8 kg (01/25 1302) Last BM Date: 03/25/21  Intake/Output from previous day: 01/25 0701 - 01/26 0700 In: 800 [I.V.:600; IV Piggyback:200] Out: -  Intake/Output this shift: No intake/output data recorded.  Physical Exam: HEENT - sclerae clear, mucous membranes moist Neck - soft Chest - clear bilaterally Cor - RRR Abdomen - soft without distension; minimal epigastric tenderness, no mass Ext - no edema, non-tender Neuro - alert & oriented, no focal deficits  Lab Results:  Recent Labs    03/26/21 0455 03/27/21 0450  WBC 6.7 4.4  HGB 10.6* 10.1*  HCT 33.2* 32.7*  PLT 179 180   BMET Recent Labs    03/26/21 0455 03/27/21 0450  NA 137 138  K 3.6 3.8  CL 104 106  CO2 25 26   GLUCOSE 168* 147*  BUN 8 7*  CREATININE 0.59 0.70  CALCIUM 8.8* 8.8*   PT/INR Recent Labs    03/25/21 0509  LABPROT 13.5  INR 1.0   Comprehensive Metabolic Panel:    Component Value Date/Time   NA 138 03/27/2021 0450   NA 137 03/26/2021 0455   K 3.8 03/27/2021 0450   K 3.6 03/26/2021 0455   CL 106 03/27/2021 0450   CL 104 03/26/2021 0455   CO2 26 03/27/2021 0450   CO2 25 03/26/2021 0455   BUN 7 (L) 03/27/2021 0450   BUN 8 03/26/2021 0455   CREATININE 0.70 03/27/2021 0450   CREATININE 0.59 03/26/2021 0455   GLUCOSE 147 (H) 03/27/2021 0450   GLUCOSE 168 (H) 03/26/2021 0455   CALCIUM 8.8 (L) 03/27/2021 0450   CALCIUM 8.8 (L) 03/26/2021 0455   AST 29 03/27/2021 0450   AST 42 (H) 03/26/2021 0455   ALT 67 (H) 03/27/2021 0450   ALT 91 (H) 03/26/2021 0455   ALKPHOS 255 (H) 03/27/2021 0450   ALKPHOS 288 (H) 03/26/2021 0455   BILITOT 1.5 (H) 03/27/2021 0450   BILITOT 1.8 (H) 03/26/2021 0455   PROT 6.0 (L) 03/27/2021 0450   PROT 6.5 03/26/2021 0455   ALBUMIN 2.8 (L) 03/27/2021 0450   ALBUMIN 3.0 (L) 03/26/2021 0455    Studies/Results: DG ERCP WITH SPHINCTEROTOMY  Result Date: 03/26/2021 CLINICAL DATA:  CBD obstruction. EXAM: ERCP COMPARISON:  CT AP, 03/18/2021.  MRCP, 03/24/2021. FLUOROSCOPY TIME:  Fluoroscopy Time:  Not provided Radiation Exposure Index (if provided by the fluoroscopic device): Not provided Number of Acquired Spot Images: Single fluoroscopic cine loop with 7 images. FINDINGS: Multiple, limited oblique planar images of the RIGHT upper quadrant obtained C-arm. Images demonstrating flexible endoscopy, biliary duct cannulation, sphincterotomy, retrograde cholangiogram and balloon sweep. No biliary ductal dilation. Small distal CBD filling defect is demonstrated. IMPRESSION: Fluoroscopic imaging for ERCP. Distal CBD filling defect, consistent with choledocholith is demonstrated. For complete description of intra procedural findings, please see performing service  dictation. Electronically Signed   By: Michaelle Birks M.D.   On: 03/26/2021 14:59      Armandina Gemma 03/27/2021   [name], female   DOB: 11-25-1950, 71 y.o.   MRN: [id number]

## 2021-03-27 NOTE — Anesthesia Preprocedure Evaluation (Addendum)
Anesthesia Evaluation  Patient identified by MRN, date of birth, ID band Patient awake    Reviewed: Allergy & Precautions, NPO status , Patient's Chart, lab work & pertinent test results  Airway Mallampati: IV  TM Distance: >3 FB Neck ROM: Full  Mouth opening: Limited Mouth Opening  Dental  (+) Dental Advisory Given Upper crowns:   Pulmonary sleep apnea ,    Pulmonary exam normal breath sounds clear to auscultation       Cardiovascular negative cardio ROS Normal cardiovascular exam Rhythm:Regular Rate:Normal     Neuro/Psych PSYCHIATRIC DISORDERS Depression negative neurological ROS     GI/Hepatic negative GI ROS, Neg liver ROS,   Endo/Other  diabetes, Type 2, Oral Hypoglycemic Agents  Renal/GU negative Renal ROS  negative genitourinary   Musculoskeletal  (+) Arthritis ,   Abdominal   Peds  Hematology  (+) Blood dyscrasia (.lastcb), anemia , Lab Results      Component                Value               Date                      WBC                      4.4                 03/27/2021                HGB                      10.1 (L)            03/27/2021                HCT                      32.7 (L)            03/27/2021                MCV                      87.4                03/27/2021                PLT                      180                 03/27/2021              Anesthesia Other Findings   Reproductive/Obstetrics                            Anesthesia Physical Anesthesia Plan  ASA: 3  Anesthesia Plan: General   Post-op Pain Management: Tylenol PO (pre-op) and Toradol IV (intra-op)   Induction: Intravenous  PONV Risk Score and Plan: 3 and Midazolam, Dexamethasone and Ondansetron  Airway Management Planned: Oral ETT  Additional Equipment:   Intra-op Plan:   Post-operative Plan: Extubation in OR  Informed Consent: I have reviewed the patients History and Physical,  chart, labs and discussed the procedure including the risks, benefits and alternatives for the proposed anesthesia with the patient  or authorized representative who has indicated his/her understanding and acceptance.     Dental advisory given  Plan Discussed with: CRNA  Anesthesia Plan Comments:         Anesthesia Quick Evaluation

## 2021-03-27 NOTE — Interval H&P Note (Signed)
History and Physical Interval Note:  03/27/2021 10:18 AM  Sabrina Arroyo  has presented today for surgery, with the diagnosis of biliary pancreatitis.  The various methods of treatment have been discussed with the patient and family. After consideration of risks, benefits and other options for treatment, the patient has consented to    Procedure(s): LAPAROSCOPIC CHOLECYSTECTOMY (N/A) as a surgical intervention.    The patient's history has been reviewed, patient examined, no change in status, stable for surgery.  I have reviewed the patient's chart and labs.  Questions were answered to the patient's satisfaction.    Darnell Level, MD [facility] practice Office: [phone]   Darnell Level

## 2021-03-27 NOTE — Progress Notes (Signed)
[facility] Gastroenterology Progress Note  Sabrina Arroyo 71 y.o. 1951/01/07  CC:  Elevated LFTs   Subjective: Patient is recovering from cholecystectomy. States she is still having abdominal pain, though it is mild. Tolerating clear liquids well. Not interested in advancing diet at the moment.  ROS : Review of Systems  Gastrointestinal:  Positive for abdominal pain. Negative for blood in stool, constipation, diarrhea, heartburn, melena, nausea and vomiting.  Genitourinary:  Negative for dysuria and urgency.     Objective: Vital signs in last 24 hours: Vitals:   03/27/21 1245 03/27/21 1308  BP: 114/64 110/70  Pulse: 66 (!) 56  Resp: 14 14  Temp: (!) 97.5 F (36.4 C) 97.7 F (36.5 C)  SpO2: 95% 97%    Physical Exam:  General:  Alert, cooperative, no distress, obese  Head:  Normocephalic, without obvious abnormality, atraumatic  Eyes:  Anicteric sclera, EOM's intact  Lungs:   Clear to auscultation bilaterally, respirations unlabored  Heart:  Regular rate and rhythm, S1, S2 normal  Abdomen:   Soft, mild generalized tenderness, bowel sounds active all four quadrants,  no masses. Laparoscopic incisions visualized    Lab Results: Recent Labs    03/25/21 0509 03/26/21 0455 03/27/21 0450  NA 134* 137 138  K 3.6 3.6 3.8  CL 102 104 106  CO2 _0 GLUCOSE 155* 168* 147*  BUN 10 8 7*  CREATININE 0.63 0.59 0.70  CALCIUM 8.6* 8.8* 8.8*  MG 1.9  --   --   PHOS 3.6  --   --    Recent Labs    03/26/21 0455 03/27/21 0450  AST 42* 29  ALT 91* 67*  ALKPHOS 288* 255*  BILITOT 1.8* 1.5*  PROT 6.5 6.0*  ALBUMIN 3.0* 2.8*   Recent Labs    03/26/21 0455 03/27/21 0450  WBC 6.7 4.4  HGB 10.6* 10.1*  HCT 33.2* 32.7*  MCV 85.6 87.4  PLT 179 180   Recent Labs    03/25/21 0509  LABPROT 13.5  INR 1.0      Assessment Elevated LFTs - s/p cholecystectomy 1/26 - s/p ERCP 1/25: main bile duct was mildly dilated.  Choledocholithiasis found and complete removal by  biliary sphincterotomy and balloon extraction - AST 29 ALT 67/ Alk Phos 255 (improving) - T. Bili 1.5  Plan: Successful ERCP and cholecystectomy Trend LFTs to normalization. [facility] will sign off. Please contact us if we can be of any further assistance during this hospital stay.   Advay Volante Radford Pax PA-C 03/27/2021, 2:42 PM  Contact #  [phone]

## 2021-03-27 NOTE — Op Note (Signed)
Procedure Note  Pre-operative Diagnosis:  biliary pancreatitis, cholelithiasis  Post-operative Diagnosis:  same  Surgeon:  Darnell Level, MD  Assistant:  Angelena Form, MD   Procedure:  Laparoscopic cholecystectomy  Anesthesia:  General  Estimated Blood Loss:  minimal  Drains: none         Specimen: gallbladder to pathology  Indications: Patient is a 71 year old female admitted with biliary pancreatitis.  Patient underwent successful ERCP with sphincterotomy.  Patient has cholelithiasis.  She now comes to surgery for cholecystectomy to prevent recurrence of biliary pancreatitis.  Procedure description: The patient was seen in the pre-op holding area. The risks, benefits, complications, treatment options, and expected outcomes were previously discussed with the patient. The patient agreed with the proposed plan and has signed the informed consent form.  The patient was transported to operating room #4 at the [facility]. The patient was placed in the supine position on the operating room table. Following induction of general anesthesia, the abdomen was prepped and draped in the usual aseptic fashion.  An incision was made in the skin near the umbilicus. The midline fascia was incised and the peritoneal cavity was entered and a Hasson cannula was introduced under direct vision. The cannula was secured with a 0-Vicryl pursestring suture. Pneumoperitoneum was established with carbon dioxide. Additional cannulae were introduced under direct vision along the right costal margin in the midline, mid-clavicular line, and anterior axillary line.   The gallbladder was identified and the fundus grasped and retracted cephalad. Adhesions were taken down bluntly and the electrocautery was utilized as needed, taking care not to involve any adjacent structures. The infundibulum was grasped and retracted laterally, exposing the peritoneum overlying the triangle of Calot. The peritoneum was incised and  structures exposed with blunt dissection. The cystic duct was clearly identified, bluntly dissected circumferentially, and clipped at the neck of the gallbladder.  The cystic duct was then ligated with ligaclips and divided. The cystic artery was identified, dissected circumferentially, ligated with ligaclips, and divided.  The gallbladder was dissected away from the gallbladder bed using the electrocautery for hemostasis. The gallbladder was completely removed from the liver and placed into an endocatch bag. The gallbladder was removed in the endocatch bag through the umbilical port site and submitted to pathology for review.  The right upper quadrant was irrigated and the gallbladder bed was inspected. Hemostasis was achieved with the electrocautery.  Cannulae were removed under direct vision and good hemostasis was noted. Pneumoperitoneum was released and the majority of the carbon dioxide evacuated. The umbilical wound was irrigated and the fascia was then closed with the pursestring suture.  Local anesthetic was infiltrated at all port sites. Skin incisions were closed with 4-0 Monocril subcuticular sutures and Dermabond was applied.  Instrument, sponge, and needle counts were correct at the conclusion of the case.  The patient was awakened from anesthesia and brought to the recovery room in stable condition.  The patient tolerated the procedure well.   Darnell Level, MD [facility] Office: [phone]

## 2021-03-27 NOTE — Transfer of Care (Signed)
Immediate Anesthesia Transfer of Care Note  Patient: Sabrina Arroyo  Procedure(s) Performed: Procedure(s): LAPAROSCOPIC CHOLECYSTECTOMY (N/A)  Patient Location: PACU  Anesthesia Type:General  Level of Consciousness:  sedated, patient cooperative and responds to stimulation  Airway & Oxygen Therapy:Patient Spontanous Breathing and Patient connected to face mask oxgen  Post-op Assessment:  Report given to PACU RN and Post -op Vital signs reviewed and stable  Post vital signs:  Reviewed and stable  Last Vitals:  Vitals:   03/27/21 0409 03/27/21 1158  BP: (!) 104/59   Pulse: (!) 54   Resp: 16   Temp: 36.7 C 36.6 C  SpO2: 98%     Complications: No apparent anesthesia complications

## 2021-03-27 NOTE — Discharge Instructions (Signed)
[facility]  Please arrive at least 30 min before your appointment to complete your check in paperwork.  If you are unable to arrive 30 min prior to your appointment time we may have to cancel or reschedule you. LAPAROSCOPIC SURGERY: POST OP INSTRUCTIONS Always review your discharge instruction sheet given to you by the facility where your surgery was performed. IF YOU HAVE DISABILITY OR FAMILY LEAVE FORMS, YOU MUST BRING THEM TO THE OFFICE FOR PROCESSING.   DO NOT GIVE THEM TO YOUR DOCTOR.  PAIN CONTROL  First take acetaminophen (Tylenol) AND/or ibuprofen (Advil) to control your pain after surgery.  Follow directions on package.  Taking acetaminophen (Tylenol) and/or ibuprofen (Advil) regularly after surgery will help to control your pain and lower the amount of prescription pain medication you may need.  You should not take more than 4,000 mg (4 grams) of acetaminophen (Tylenol) in 24 hours.  You should not take ibuprofen (Advil), aleve, motrin, naprosyn or other NSAIDS if you have a history of stomach ulcers or chronic kidney disease.  A prescription for pain medication may be given to you upon discharge.  Take your pain medication as prescribed, if you still have uncontrolled pain after taking acetaminophen (Tylenol) or ibuprofen (Advil). Use ice packs to help control pain. If you need a refill on your pain medication, please contact your pharmacy.  They will contact our office to request authorization. Prescriptions will not be filled after 5pm or on week-ends.  HOME MEDICATIONS Take your usually prescribed medications unless otherwise directed.  DIET You should follow a light diet the first few days after arrival home.  Be sure to include lots of fluids daily. Avoid fatty, fried foods.   CONSTIPATION It is common to experience some constipation after surgery and if you are taking pain medication.  Increasing fluid intake and taking a stool softener (such as Colace)  will usually help or prevent this problem from occurring.  A mild laxative (Milk of Magnesia or Miralax) should be taken according to package instructions if there are no bowel movements after 48 hours.  WOUND/INCISION CARE Most patients will experience some swelling and bruising in the area of the incisions.  Ice packs will help.  Swelling and bruising can take several days to resolve.  Unless discharge instructions indicate otherwise, follow guidelines below  STERI-STRIPS - you may remove your outer bandages 48 hours after surgery, and you may shower at that time.  You have steri-strips (small skin tapes) in place directly over the incision.  These strips should be left on the skin for 7-10 days.   DERMABOND/SKIN GLUE - you may shower in 24 hours.  The glue will flake off over the next 2-3 weeks. Any sutures or staples will be removed at the office during your follow-up visit.  ACTIVITIES You may resume regular (light) daily activities beginning the next day--such as daily self-care, walking, climbing stairs--gradually increasing activities as tolerated.  You may have sexual intercourse when it is comfortable.  Refrain from any heavy lifting or straining until approved by your doctor. You may drive when you are no longer taking prescription pain medication, you can comfortably wear a seatbelt, and you can safely maneuver your car and apply brakes.  FOLLOW-UP You should see your doctor in the office for a follow-up appointment approximately 2-3 weeks after your surgery.  You should have been given your post-op/follow-up appointment when your surgery was scheduled.  If you did not receive a post-op/follow-up appointment, make sure   that you call for this appointment within a day or two after you arrive home to insure a convenient appointment time.  OTHER INSTRUCTIONS  WHEN TO CALL YOUR DOCTOR: Fever over 101.0 Inability to urinate Continued bleeding from incision. Increased pain, redness, or  drainage from the incision. Increasing abdominal pain  The clinic staff is available to answer your questions during regular business hours.  Please don't hesitate to call and ask to speak to one of the nurses for clinical concerns.  If you have a medical emergency, go to the nearest emergency room or call 911.  A surgeon from [facility] is always on call at the hospital. [address], [address], [place], [place]  [address] ? [address] [address], [place], [place]   [address] [phone] ? [phone] ? FAX [phone]   

## 2021-03-27 NOTE — Anesthesia Postprocedure Evaluation (Signed)
Anesthesia Post Note  Patient: Sabrina Arroyo  Procedure(s) Performed: LAPAROSCOPIC CHOLECYSTECTOMY (Abdomen)     Patient location during evaluation: PACU Anesthesia Type: General Level of consciousness: awake and alert Pain management: pain level controlled Vital Signs Assessment: post-procedure vital signs reviewed and stable Respiratory status: spontaneous breathing, nonlabored ventilation, respiratory function stable and patient connected to nasal cannula oxygen Cardiovascular status: blood pressure returned to baseline and stable Postop Assessment: no apparent nausea or vomiting Anesthetic complications: no   No notable events documented.  Last Vitals:  Vitals:   03/27/21 1245 03/27/21 1308  BP: 114/64 110/70  Pulse: 66 (!) 56  Resp: 14 14  Temp: (!) 36.4 C 36.5 C  SpO2: 95% 97%    Last Pain:  Vitals:   03/27/21 1401  TempSrc:   PainSc: 6                  Michalla Ringer L Jerrion Tabbert

## 2021-03-28 ENCOUNTER — Encounter (HOSPITAL_COMMUNITY): Payer: Self-pay | Admitting: Surgery

## 2021-03-28 LAB — CBC
HCT: 35.2 % — ABNORMAL LOW (ref 36.0–46.0)
Hemoglobin: 11.1 g/dL — ABNORMAL LOW (ref 12.0–15.0)
MCH: 27.1 pg (ref 26.0–34.0)
MCHC: 31.5 g/dL (ref 30.0–36.0)
MCV: 85.9 fL (ref 80.0–100.0)
Platelets: 253 10*3/uL (ref 150–400)
RBC: 4.1 MIL/uL (ref 3.87–5.11)
RDW: 15.2 % (ref 11.5–15.5)
WBC: 10.9 10*3/uL — ABNORMAL HIGH (ref 4.0–10.5)
nRBC: 0 % (ref 0.0–0.2)

## 2021-03-28 LAB — LIPASE, BLOOD: Lipase: 36 U/L (ref 11–51)

## 2021-03-28 LAB — COMPREHENSIVE METABOLIC PANEL
ALT: 59 U/L — ABNORMAL HIGH (ref 0–44)
AST: 37 U/L (ref 15–41)
Albumin: 3 g/dL — ABNORMAL LOW (ref 3.5–5.0)
Alkaline Phosphatase: 230 U/L — ABNORMAL HIGH (ref 38–126)
Anion gap: 9 (ref 5–15)
BUN: 11 mg/dL (ref 8–23)
CO2: 26 mmol/L (ref 22–32)
Calcium: 9 mg/dL (ref 8.9–10.3)
Chloride: 100 mmol/L (ref 98–111)
Creatinine, Ser: 0.9 mg/dL (ref 0.44–1.00)
GFR, Estimated: >60 mL/min (ref >60)
Glucose, Bld: 148 mg/dL — ABNORMAL HIGH (ref 70–99)
Potassium: 3.6 mmol/L (ref 3.5–5.1)
Sodium: 135 mmol/L (ref 135–145)
Total Bilirubin: 1.3 mg/dL — ABNORMAL HIGH (ref 0.3–1.2)
Total Protein: 6.6 g/dL (ref 6.5–8.1)

## 2021-03-28 LAB — GLUCOSE, CAPILLARY
Glucose-Capillary: 137 mg/dL — ABNORMAL HIGH (ref 70–99)
Glucose-Capillary: 177 mg/dL — ABNORMAL HIGH (ref 70–99)
Glucose-Capillary: 141 mg/dL — ABNORMAL HIGH (ref 70–99)

## 2021-03-28 LAB — SURGICAL PATHOLOGY

## 2021-03-28 MED ORDER — LACTULOSE 10 GM/15ML PO SOLN
10.0000 g | Freq: Once | ORAL | Status: AC
  Administered 2021-03-28: 10 g via ORAL
  Filled 2021-03-28: qty 15

## 2021-03-28 MED ORDER — BISACODYL 10 MG RE SUPP
10.0000 mg | Freq: Once | RECTAL | Status: AC
  Administered 2021-03-28: 10 mg via RECTAL
  Filled 2021-03-28: qty 1

## 2021-03-28 MED ORDER — TRAMADOL HCL 50 MG PO TABS: 50.0000 mg | ORAL_TABLET | Freq: Four times a day (QID) | ORAL | 0 refills | Status: AC | PRN

## 2021-03-28 MED ORDER — ACETAMINOPHEN 500 MG PO TABS: 500.0000 mg | ORAL_TABLET | Freq: Four times a day (QID) | ORAL | 0 refills | Status: AC | PRN

## 2021-03-28 NOTE — Plan of Care (Signed)

## 2021-03-28 NOTE — Progress Notes (Signed)
1 Day Post-Op  Subjective: Patient sore, but otherwise doing well.  Hasn't had a BM in 3 days.  Just took a suppository.  Eating well with no nausea.    ROS: See above, otherwise other systems negative  Objective: Vital signs in last 24 hours: Temp:  [97.5 F (36.4 C)-99 F (37.2 C)] 98.3 F (36.8 C) (01/27 0355) Pulse Rate:  [56-78] 65 (01/27 0355) Resp:  [13-22] 18 (01/27 0355) BP: (107-118)/(62-78) 108/78 (01/27 0355) SpO2:  [95 %-100 %] 96 % (01/27 0355) Last BM Date: 03/25/21  Intake/Output from previous day: 01/26 0701 - 01/27 0700 In: 4513.1 [I.V.:4513.1] Out: -  Intake/Output this shift: No intake/output data recorded.  PE: Abd: soft, appropriately tender, incisions c/d/I, +BS, ND  Lab Results:  Recent Labs    03/27/21 0450 03/28/21 0459  WBC 4.4 10.9*  HGB 10.1* 11.1*  HCT 32.7* 35.2*  PLT 180 253   BMET Recent Labs    03/27/21 0450 03/28/21 0459  NA 138 135  K 3.8 3.6  CL 106 100  CO2 26 26  GLUCOSE 147* 148*  BUN 7* 11  CREATININE 0.70 0.90  CALCIUM 8.8* 9.0   PT/INR No results for input(s): LABPROT, INR in the last 72 hours. CMP     Component Value Date/Time   NA 135 03/28/2021 0459   K 3.6 03/28/2021 0459   CL 100 03/28/2021 0459   CO2 26 03/28/2021 0459   GLUCOSE 148 (H) 03/28/2021 0459   BUN 11 03/28/2021 0459   CREATININE 0.90 03/28/2021 0459   CALCIUM 9.0 03/28/2021 0459   PROT 6.6 03/28/2021 0459   ALBUMIN 3.0 (L) 03/28/2021 0459   AST 37 03/28/2021 0459   ALT 59 (H) 03/28/2021 0459   ALKPHOS 230 (H) 03/28/2021 0459   BILITOT 1.3 (H) 03/28/2021 0459   GFRNONAA >60 03/28/2021 0459   Lipase     Component Value Date/Time   LIPASE 36 03/28/2021 0459       Studies/Results: DG ERCP WITH SPHINCTEROTOMY  Result Date: 03/26/2021 CLINICAL DATA:  CBD obstruction. EXAM: ERCP COMPARISON:  CT AP, 03/18/2021.  MRCP, 03/24/2021. FLUOROSCOPY TIME:  Fluoroscopy Time:  Not provided Radiation Exposure Index (if provided by the  fluoroscopic device): Not provided Number of Acquired Spot Images: Single fluoroscopic cine loop with 7 images. FINDINGS: Multiple, limited oblique planar images of the RIGHT upper quadrant obtained C-arm. Images demonstrating flexible endoscopy, biliary duct cannulation, sphincterotomy, retrograde cholangiogram and balloon sweep. No biliary ductal dilation. Small distal CBD filling defect is demonstrated. IMPRESSION: Fluoroscopic imaging for ERCP. Distal CBD filling defect, consistent with choledocholith is demonstrated. For complete description of intra procedural findings, please see performing service dictation. Electronically Signed   By: Roanna Banning M.D.   On: 03/26/2021 14:59    Anti-infectives: Anti-infectives (From admission, onward)    None        Assessment/Plan POD 1, s/p lap chole by Dr. Gerrit Friends, 1/27 for biliary pancreatitis -doing well surgically.  Tolerating a diet and pain is well controlled -surgically stable for DC home -she has narcotics at home already for pain so no new ones will be prescribed. -she has follow up arranged as well. -bowel regimen at home as needed for constipation  FEN - regular VTE - ok for chemical prophylaxis ID - no further needed   LOS: 4 days    Letha Cape , [facility] 03/28/2021, 11:24 AM Please see Amion for pager number during day hours 7:00am-4:30pm or 7:00am -11:30am  on weekends

## 2021-03-28 NOTE — Discharge Summary (Signed)
Physician Discharge Summary  Sabrina Arroyo [id number] DOB: 1950/12/02 DOA: 03/23/2021  PCP: Deeann Cree, MD  Admit date: 03/23/2021 Discharge date: 03/28/2021  Admitted From: Home Disposition: Home Recommendations for Outpatient Follow-up:  Follow up with PCP in 1-2 weeks Please obtain BMP/CBC in one week Please follow up with general surgery Home Health: None Equipment/Devices: None  Discharge Condition: Stable CODE STATUS: Full code Diet recommendation: Cardiac Brief/Interim Summary::70yo with a history of DM2, depression, HLD, and sleep apnea who presented to the ED with epigastric abdominal pain which radiated through to the back described as sharp and stabbing in nature associated with nausea and vomiting.  There was an abrupt onset of the symptoms and they persisted for approximately 24 hours.  In the ER she was found to have a lipase of 1000 with mildly elevated transaminases and a total bilirubin of 3.6.  Ultrasound abdomen noted gallbladder stones without acute cholecystitis but dilated extrahepatic biliary ducts.   Discharge Diagnoses:  Principal Problem:   Abdominal pain Active Problems:   Gallstones   Elevated LFTs   Diabetes mellitus type 2 in obese (HCC)   Malnutrition of moderate degree    #1 acute pancreatitis secondary to gallstones  MRCP with common bile duct stone.   status post ERCP 03/26/2021.  General surgery was consulted for possible cholecystectomy. Status post laparoscopic cholecystectomy Dr. Harlow Asa ERCP Dr. Therisa Doyne 03/26/2021 with entire main bile duct was mildly dilated.  Choledocholithiasis found.  Complete removal was accomplished by biliary sphincterotomy and balloon extraction. Follow-up with general surgery on discharge     #2 type 2 diabetes continue metformin.   #3 hyperlipidemia statins on Crestor.  #4 hypokalemia resolved  #5 sleep apnea on CPAP at night at home   #6 depression on Abilify and Adderall  #7 constipation  she was discharged on stool softeners she was given Dulcolax and lactulose prior to discharge.    Nutrition Problem: Moderate Malnutrition Etiology: acute illness    Signs/Symptoms: percent weight loss, energy intake < 75% for > 7 days     Interventions: Premier Protein, MVI  Estimated body mass index is 31.12 kg/m as calculated from the following:   Height as of this encounter: 5\' 5"  (1.651 m).   Weight as of this encounter: 84.8 kg.  Discharge Instructions  Discharge Instructions     Diet - low sodium heart healthy   Complete by: As directed    Increase activity slowly   Complete by: As directed    No wound care   Complete by: As directed       Allergies as of 03/28/2021       Reactions   Codeine Itching, Rash        Medication List     TAKE these medications    acetaminophen 500 MG tablet Commonly known as: TYLENOL Take 1 tablet (500 mg total) by mouth every 6 (six) hours as needed for mild pain, fever or headache.   amphetamine-dextroamphetamine 20 MG 24 hr capsule Commonly known as: ADDERALL XR Take 20 mg by mouth daily.   ARIPiprazole 5 MG tablet Commonly known as: ABILIFY Take 5 mg by mouth at bedtime.   aspirin 81 MG EC tablet Take 81 mg by mouth daily.   metFORMIN 500 MG 24 hr tablet Commonly known as: GLUCOPHAGE-XR Take 1,000 mg by mouth 2 (two) times daily.   multivitamin with minerals tablet Take 1 tablet by mouth daily. Woman   omeprazole 20 MG capsule Commonly known as: PRILOSEC Take 20 mg  by mouth at bedtime.   ondansetron 4 MG tablet Commonly known as: Zofran Take 1 tablet (4 mg total) by mouth daily as needed for nausea or vomiting.   pramipexole 0.5 MG tablet Commonly known as: MIRAPEX Take 0.5 mg by mouth at bedtime.   rosuvastatin 40 MG tablet Commonly known as: CRESTOR Take 40 mg by mouth at bedtime.   traMADol 50 MG tablet Commonly known as: ULTRAM Take 1 tablet (50 mg total) by mouth every 6 (six) hours as  needed for up to 7 days (mild pain). What changed: reasons to take this   vortioxetine HBr 20 MG Tabs tablet Commonly known as: TRINTELLIX Take 20 mg by mouth at bedtime.        Follow-up [facility], [place]. Go on 04/15/2021.   Specialty: General Surgery Why: Your appointment is 04/15/21 at 11:15 am Please arrive 30 minutes prior to your appointment to check in and fill out paperwork. Bring photo ID and insurance information. Contact information: [address] [place] [phone]        Deeann Cree, MD Follow up.   Specialty: Internal Medicine Contact information: [place] [address] [phone]                Allergies  Allergen Reactions   Codeine Itching and Rash    Consultations: GI and general surgery   Procedures/Studies: CT ABDOMEN PELVIS W CONTRAST  Result Date: 03/18/2021 CLINICAL DATA:  Generalized abdominal pain for several hours, initial encounter EXAM: CT ABDOMEN AND PELVIS WITH CONTRAST TECHNIQUE: Multidetector CT imaging of the abdomen and pelvis was performed using the standard protocol following bolus administration of intravenous contrast. RADIATION DOSE REDUCTION: This exam was performed according to the departmental dose-optimization program which includes automated exposure control, adjustment of the mA and/or kV according to patient size and/or use of iterative reconstruction technique. CONTRAST:  168mL OMNIPAQUE IOHEXOL 300 MG/ML  SOLN COMPARISON:  02/07/2021 FINDINGS: Lower chest: Lung bases demonstrate no focal infiltrate or sizable effusion. Hepatobiliary: Liver is well visualized and within normal limits. Multiple dependent densities are noted consistent with cholelithiasis stable from the prior exam. Pancreas: Unremarkable. No pancreatic ductal dilatation or surrounding inflammatory changes. Spleen: Normal in size without focal abnormality.  Adrenals/Urinary Tract: 2.3 cm fatty lesion arising from the right adrenal gland is noted consistent with myelolipoma. This is stable from the prior study. Left adrenal gland is within normal limits. Kidneys demonstrate a normal enhancement pattern bilaterally. No obstructive changes are seen. No renal calculi are noted. The bladder is partially distended. Stomach/Bowel: Scattered diverticular changes noted. No evidence of diverticulitis is seen. The appendix is not well visualized and may have been surgically removed. No inflammatory changes are seen. Stomach and small bowel appear within normal limits. Esophagus demonstrates some distal wall thickening stable from the prior study. Vascular/Lymphatic: Aortic atherosclerosis. No enlarged abdominal or pelvic lymph nodes. Reproductive: Status post hysterectomy. No adnexal masses. Other: No abdominal wall hernia or abnormality. No abdominopelvic ascites. Musculoskeletal: Degenerative changes of lumbar spine are noted. IMPRESSION: Diverticulosis without diverticulitis. 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 Stable distal esophageal wall thickening. Cholelithiasis without complicating factors. Electronically Signed   By: Inez Catalina M.D.   On: 03/18/2021 23:53   MR ABDOMEN MRCP WO CONTRAST  Result Date: 03/24/2021 CLINICAL DATA:  Right upper quadrant pain. Cholelithiasis. Mild dilatation of common bile duct on recent ultrasound. EXAM: MRI ABDOMEN WITHOUT CONTRAST  (  INCLUDING MRCP) TECHNIQUE: Multiplanar multisequence MR imaging of the abdomen was performed. Heavily T2-weighted images of the biliary and pancreatic ducts were obtained, and three-dimensional MRCP images were rendered by post processing. COMPARISON:  Ultrasound on 03/23/2021, and CT on 03/18/2021 FINDINGS: Lower chest: Tiny right pleural effusion. Hepatobiliary: No masses visualized on this unenhanced exam. Multiple sub-cm gallstones are again seen. Mild diffuse gallbladder wall  thickening is seen, however there is no evidence of pericholecystic inflammatory changes or fluid. The common bile duct measures 6 mm in diameter which is at the upper limits of normal. A 5 mm calculus is seen within the distal common bile duct. Pancreas: No mass or inflammatory process visualized on this unenhanced exam. Pancreas divisum is noted, however there is no evidence of pancreatic ductal dilatation. Spleen:  Within normal limits in size. Adrenals/Urinary tract: Normal adrenal glands. 1 cm cyst noted in midpole of left kidney. No evidence of hydronephrosis. Stomach/Bowel: Unremarkable. Vascular/Lymphatic: No pathologically enlarged lymph nodes identified. No evidence of abdominal aortic aneurysm. Other:  None. Musculoskeletal:  No suspicious bone lesions identified. IMPRESSION: Cholelithiasis. Mild diffuse gallbladder wall thickening, without pericholecystic inflammatory changes or fluid. Cholecystitis cannot be excluded. Consider nuclear medicine hepatobiliary scan for further evaluation if clinically warranted. 5 mm calculus in the distal common bile duct. Borderline dilatation of common bile duct measuring 6 mm. Pancreas divisum incidentally noted. No evidence of pancreatic ductal dilatation. Electronically Signed   By: Marlaine Hind M.D.   On: 03/24/2021 09:54   MR 3D Recon At Scanner  Result Date: 03/24/2021 CLINICAL DATA:  Right upper quadrant pain. Cholelithiasis. Mild dilatation of common bile duct on recent ultrasound. EXAM: MRI ABDOMEN WITHOUT CONTRAST  (INCLUDING MRCP) TECHNIQUE: Multiplanar multisequence MR imaging of the abdomen was performed. Heavily T2-weighted images of the biliary and pancreatic ducts were obtained, and three-dimensional MRCP images were rendered by post processing. COMPARISON:  Ultrasound on 03/23/2021, and CT on 03/18/2021 FINDINGS: Lower chest: Tiny right pleural effusion. Hepatobiliary: No masses visualized on this unenhanced exam. Multiple sub-cm gallstones are  again seen. Mild diffuse gallbladder wall thickening is seen, however there is no evidence of pericholecystic inflammatory changes or fluid. The common bile duct measures 6 mm in diameter which is at the upper limits of normal. A 5 mm calculus is seen within the distal common bile duct. Pancreas: No mass or inflammatory process visualized on this unenhanced exam. Pancreas divisum is noted, however there is no evidence of pancreatic ductal dilatation. Spleen:  Within normal limits in size. Adrenals/Urinary tract: Normal adrenal glands. 1 cm cyst noted in midpole of left kidney. No evidence of hydronephrosis. Stomach/Bowel: Unremarkable. Vascular/Lymphatic: No pathologically enlarged lymph nodes identified. No evidence of abdominal aortic aneurysm. Other:  None. Musculoskeletal:  No suspicious bone lesions identified. IMPRESSION: Cholelithiasis. Mild diffuse gallbladder wall thickening, without pericholecystic inflammatory changes or fluid. Cholecystitis cannot be excluded. Consider nuclear medicine hepatobiliary scan for further evaluation if clinically warranted. 5 mm calculus in the distal common bile duct. Borderline dilatation of common bile duct measuring 6 mm. Pancreas divisum incidentally noted. No evidence of pancreatic ductal dilatation. Electronically Signed   By: Marlaine Hind M.D.   On: 03/24/2021 09:54   US Abdomen Limited  Result Date: 03/23/2021 CLINICAL DATA:  Right upper quadrant abdominal pain EXAM: ULTRASOUND ABDOMEN LIMITED RIGHT UPPER QUADRANT COMPARISON:  None. FINDINGS: Gallbladder: Multiple layering gallstones are seen within the gallbladder. The gallbladder, however, is not distended, there is no gallbladder wall thickening, and no pericholecystic fluid is identified. The sonographic Percell Miller  sign is reportedly negative. Common bile duct: Diameter: Dilated measuring 9-10 mm in proximal diameter. Liver: No focal lesion identified. Within normal limits in parenchymal echogenicity. Portal vein  is patent on color Doppler imaging with normal direction of blood flow towards the liver. Other: None. IMPRESSION: Cholelithiasis without sonographic evidence of acute cholecystitis. Dilation of the extrahepatic bile duct. A distal obstructing lesion is not excluded and correlation with liver enzymes may be helpful. If abnormal, ERCP or MRCP examination would be more helpful for assessment of the distal extrahepatic bile duct. Electronically Signed   By: Fidela Salisbury M.D.   On: 03/23/2021 22:41   DG ERCP WITH SPHINCTEROTOMY  Result Date: 03/26/2021 CLINICAL DATA:  CBD obstruction. EXAM: ERCP COMPARISON:  CT AP, 03/18/2021.  MRCP, 03/24/2021. FLUOROSCOPY TIME:  Fluoroscopy Time:  Not provided Radiation Exposure Index (if provided by the fluoroscopic device): Not provided Number of Acquired Spot Images: Single fluoroscopic cine loop with 7 images. FINDINGS: Multiple, limited oblique planar images of the RIGHT upper quadrant obtained C-arm. Images demonstrating flexible endoscopy, biliary duct cannulation, sphincterotomy, retrograde cholangiogram and balloon sweep. No biliary ductal dilation. Small distal CBD filling defect is demonstrated. IMPRESSION: Fluoroscopic imaging for ERCP. Distal CBD filling defect, consistent with choledocholith is demonstrated. For complete description of intra procedural findings, please see performing service dictation. Electronically Signed   By: Michaelle Birks M.D.   On: 03/26/2021 14:59   (Echo, Carotid, EGD, Colonoscopy, ERCP)    Subjective:  Patient seen sitting up in the toilet complaining of constipation She is anxious to go home denies any nausea vomiting Discharge Exam: Vitals:   03/27/21 1952 03/28/21 0355  BP: 108/76 108/78  Pulse: 78 65  Resp: 18 18  Temp: 99 F (37.2 C) 98.3 F (36.8 C)  SpO2: 96% 96%   Vitals:   03/27/21 1245 03/27/21 1308 03/27/21 1952 03/28/21 0355  BP: 114/64 110/70 108/76 108/78  Pulse: 66 (!) 56 78 65  Resp: 14 14 18 18    Temp: (!) 97.5 F (36.4 C) 97.7 F (36.5 C) 99 F (37.2 C) 98.3 F (36.8 C)  TempSrc:  Oral Oral Oral  SpO2: 95% 97% 96% 96%  Weight:      Height:        General: Pt is alert, awake, not in acute distress Cardiovascular: RRR, S1/S2 +, no rubs, no gallops Respiratory: CTA bilaterally, no wheezing, no rhonchi Abdominal: Soft, NT, ND, bowel sounds + Extremities: no edema, no cyanosis    The results of significant diagnostics from this hospitalization (including imaging, microbiology, ancillary and laboratory) are listed below for reference.     Microbiology: Recent Results (from the past 240 hour(s))  Resp Panel by RT-PCR (Flu A&B, Covid) Nasopharyngeal Swab     Status: None   Collection Time: 03/24/21 12:58 AM   Specimen: Nasopharyngeal Swab; Nasopharyngeal(NP) swabs in vial transport medium  Result Value Ref Range Status   SARS Coronavirus 2 by RT PCR NEGATIVE NEGATIVE Final    Comment: (NOTE) SARS-CoV-2 target nucleic acids are NOT DETECTED.  The SARS-CoV-2 RNA is generally detectable in upper respiratory specimens during the acute phase of infection. The lowest concentration of SARS-CoV-2 viral copies this assay can detect is 138 copies/mL. A negative result does not preclude SARS-Cov-2 infection and should not be used as the sole basis for treatment or other patient management decisions. A negative result may occur with  improper specimen collection/handling, submission of specimen other than nasopharyngeal swab, presence of viral mutation(s) within the areas targeted by  this assay, and inadequate number of viral copies(<138 copies/mL). A negative result must be combined with clinical observations, patient history, and epidemiological information. The expected result is Negative.  Fact Sheet for Patients:  EntrepreneurPulse.com.au  Fact Sheet for Healthcare Providers:  IncredibleEmployment.be  This test is no t yet approved or  cleared by the [place] FDA and  has been authorized for detection and/or diagnosis of SARS-CoV-2 by FDA under an Emergency Use Authorization (EUA). This EUA will remain  in effect (meaning this test can be used) for the duration of the COVID-19 declaration under Section 564(b)(1) of the Act, 21 U.S.C.section 360bbb-3(b)(1), unless the authorization is terminated  or revoked sooner.       Influenza A by PCR NEGATIVE NEGATIVE Final   Influenza B by PCR NEGATIVE NEGATIVE Final    Comment: (NOTE) The Xpert Xpress SARS-CoV-2/FLU/RSV plus assay is intended as an aid in the diagnosis of influenza from Nasopharyngeal swab specimens and should not be used as a sole basis for treatment. Nasal washings and aspirates are unacceptable for Xpert Xpress SARS-CoV-2/FLU/RSV testing.  Fact Sheet for Patients: EntrepreneurPulse.com.au  Fact Sheet for Healthcare Providers: IncredibleEmployment.be  This test is not yet approved or cleared by the [place] FDA and has been authorized for detection and/or diagnosis of SARS-CoV-2 by FDA under an Emergency Use Authorization (EUA). This EUA will remain in effect (meaning this test can be used) for the duration of the COVID-19 declaration under Section 564(b)(1) of the Act, 21 U.S.C. section 360bbb-3(b)(1), unless the authorization is terminated or revoked.  Performed at [facility], [place] [address], [place], [place] [address]      Labs: BNP (last 3 results) No results for input(s): BNP in the last 8760 hours. Basic Metabolic Panel: Recent Labs  Lab 03/24/21 0459 03/25/21 0509 03/26/21 0455 03/27/21 0450 03/28/21 0459  NA 133* 134* 137 138 135  K 3.5 3.6 3.6 3.8 3.6  CL 97* 102 104 106 100  CO2 27 26 25 26 26   GLUCOSE 184* 155* 168* 147* 148*  BUN 13 10 8  7* 11  CREATININE 0.55 0.63 0.59 0.70 0.90  CALCIUM 8.8* 8.6* 8.8* 8.8* 9.0  MG  --  1.9  --   --   --   PHOS  --  3.6   --   --   --    Liver Function Tests: Recent Labs  Lab 03/24/21 0459 03/25/21 0509 03/26/21 0455 03/27/21 0450 03/28/21 0459  AST 160* 94* 42* 29 37  ALT 143* 126* 91* 67* 59*  ALKPHOS 279* 303* 288* 255* 230*  BILITOT 3.6* 3.0* 1.8* 1.5* 1.3*  PROT 6.5 6.3* 6.5 6.0* 6.6  ALBUMIN 3.0* 2.8* 3.0* 2.8* 3.0*   Recent Labs  Lab 03/23/21 2051 03/25/21 0509 03/27/21 0450 03/28/21 0459  LIPASE 1,016* 46 46 36   No results for input(s): AMMONIA in the last 168 hours. CBC: Recent Labs  Lab 03/24/21 0459 03/25/21 0509 03/26/21 0455 03/27/21 0450 03/28/21 0459  WBC 9.5 5.3 6.7 4.4 10.9*  NEUTROABS 8.3*  --   --   --   --   HGB 11.3* 10.4* 10.6* 10.1* 11.1*  HCT 34.1* 32.3* 33.2* 32.7* 35.2*  MCV 83.0 85.0 85.6 87.4 85.9  PLT 182 149* 179 180 253   Cardiac Enzymes: No results for input(s): CKTOTAL, CKMB, CKMBINDEX, TROPONINI in the last 168 hours. BNP: Invalid input(s): POCBNP CBG: Recent Labs  Lab 03/27/21 1954 03/27/21 2353 03/28/21 0400 03/28/21 0755 03/28/21 1156  GLUCAP  215* 129* 137* 177* 141*   D-Dimer No results for input(s): DDIMER in the last 72 hours. Hgb A1c No results for input(s): HGBA1C in the last 72 hours. Lipid Profile No results for input(s): CHOL, HDL, LDLCALC, TRIG, CHOLHDL, LDLDIRECT in the last 72 hours. Thyroid function studies No results for input(s): TSH, T4TOTAL, T3FREE, THYROIDAB in the last 72 hours.  Invalid input(s): FREET3 Anemia work up No results for input(s): VITAMINB12, FOLATE, FERRITIN, TIBC, IRON, RETICCTPCT in the last 72 hours. Urinalysis    Component Value Date/Time   COLORURINE AMBER (A) 03/23/2021 2051   APPEARANCEUR HAZY (A) 03/23/2021 2051   LABSPEC 1.014 03/23/2021 2051   PHURINE 6.0 03/23/2021 2051   GLUCOSEU NEGATIVE 03/23/2021 2051   HGBUR NEGATIVE 03/23/2021 2051   BILIRUBINUR NEGATIVE 03/23/2021 2051   KETONESUR 20 (A) 03/23/2021 2051   PROTEINUR 30 (A) 03/23/2021 2051   NITRITE NEGATIVE 03/23/2021  2051   LEUKOCYTESUR NEGATIVE 03/23/2021 2051   Sepsis Labs Invalid input(s): PROCALCITONIN,  WBC,  LACTICIDVEN Microbiology Recent Results (from the past 240 hour(s))  Resp Panel by RT-PCR (Flu A&B, Covid) Nasopharyngeal Swab     Status: None   Collection Time: 03/24/21 12:58 AM   Specimen: Nasopharyngeal Swab; Nasopharyngeal(NP) swabs in vial transport medium  Result Value Ref Range Status   SARS Coronavirus 2 by RT PCR NEGATIVE NEGATIVE Final    Comment: (NOTE) SARS-CoV-2 target nucleic acids are NOT DETECTED.  The SARS-CoV-2 RNA is generally detectable in upper respiratory specimens during the acute phase of infection. The lowest concentration of SARS-CoV-2 viral copies this assay can detect is 138 copies/mL. A negative result does not preclude SARS-Cov-2 infection and should not be used as the sole basis for treatment or other patient management decisions. A negative result may occur with  improper specimen collection/handling, submission of specimen other than nasopharyngeal swab, presence of viral mutation(s) within the areas targeted by this assay, and inadequate number of viral copies(<138 copies/mL). A negative result must be combined with clinical observations, patient history, and epidemiological information. The expected result is Negative.  Fact Sheet for Patients:  EntrepreneurPulse.com.au  Fact Sheet for Healthcare Providers:  IncredibleEmployment.be  This test is no t yet approved or cleared by the [place] FDA and  has been authorized for detection and/or diagnosis of SARS-CoV-2 by FDA under an Emergency Use Authorization (EUA). This EUA will remain  in effect (meaning this test can be used) for the duration of the COVID-19 declaration under Section 564(b)(1) of the Act, 21 U.S.C.section 360bbb-3(b)(1), unless the authorization is terminated  or revoked sooner.       Influenza A by PCR NEGATIVE NEGATIVE Final    Influenza B by PCR NEGATIVE NEGATIVE Final    Comment: (NOTE) The Xpert Xpress SARS-CoV-2/FLU/RSV plus assay is intended as an aid in the diagnosis of influenza from Nasopharyngeal swab specimens and should not be used as a sole basis for treatment. Nasal washings and aspirates are unacceptable for Xpert Xpress SARS-CoV-2/FLU/RSV testing.  Fact Sheet for Patients: EntrepreneurPulse.com.au  Fact Sheet for Healthcare Providers: IncredibleEmployment.be  This test is not yet approved or cleared by the [place] FDA and has been authorized for detection and/or diagnosis of SARS-CoV-2 by FDA under an Emergency Use Authorization (EUA). This EUA will remain in effect (meaning this test can be used) for the duration of the COVID-19 declaration under Section 564(b)(1) of the Act, 21 U.S.C. section 360bbb-3(b)(1), unless the authorization is terminated or revoked.  Performed at [facility], [address] [place], [place] [address]      Time coordinating discharge: 39 minutes  SIGNED:  Georgette Shell, MD  [facility] 03/28/2021, 3:24 PM

## 2021-12-11 ENCOUNTER — Emergency Department: Payer: Medicare Other

## 2021-12-11 ENCOUNTER — Other Ambulatory Visit: Payer: Self-pay

## 2021-12-11 ENCOUNTER — Observation Stay
Admission: EM | Admit: 2021-12-11 | Discharge: 2021-12-13 | Disposition: A | Discharge status: Home / Self Care | Payer: Medicare Other | Attending: Internal Medicine | Admitting: Internal Medicine

## 2021-12-11 DIAGNOSIS — Z6832 Body mass index (BMI) 32.0-32.9, adult: Secondary | ICD-10-CM | POA: Insufficient documentation

## 2021-12-11 DIAGNOSIS — I700 Atherosclerosis of aorta: Secondary | ICD-10-CM | POA: Diagnosis not present

## 2021-12-11 DIAGNOSIS — E119 Type 2 diabetes mellitus without complications: Secondary | ICD-10-CM | POA: Insufficient documentation

## 2021-12-11 DIAGNOSIS — Z7984 Long term (current) use of oral hypoglycemic drugs: Secondary | ICD-10-CM | POA: Insufficient documentation

## 2021-12-11 DIAGNOSIS — R5381 Other malaise: Secondary | ICD-10-CM | POA: Insufficient documentation

## 2021-12-11 DIAGNOSIS — J189 Pneumonia, unspecified organism: Secondary | ICD-10-CM | POA: Diagnosis not present

## 2021-12-11 DIAGNOSIS — D72829 Elevated white blood cell count, unspecified: Secondary | ICD-10-CM | POA: Diagnosis not present

## 2021-12-11 DIAGNOSIS — Z1152 Encounter for screening for COVID-19: Secondary | ICD-10-CM | POA: Diagnosis not present

## 2021-12-11 DIAGNOSIS — R531 Weakness: Secondary | ICD-10-CM

## 2021-12-11 DIAGNOSIS — Z79899 Other long term (current) drug therapy: Secondary | ICD-10-CM | POA: Diagnosis not present

## 2021-12-11 DIAGNOSIS — R0602 Shortness of breath: Secondary | ICD-10-CM | POA: Diagnosis not present

## 2021-12-11 DIAGNOSIS — Z7982 Long term (current) use of aspirin: Secondary | ICD-10-CM | POA: Insufficient documentation

## 2021-12-11 DIAGNOSIS — J988 Other specified respiratory disorders: Secondary | ICD-10-CM | POA: Diagnosis not present

## 2021-12-11 DIAGNOSIS — F32A Depression, unspecified: Secondary | ICD-10-CM

## 2021-12-11 DIAGNOSIS — R059 Cough, unspecified: Secondary | ICD-10-CM | POA: Diagnosis present

## 2021-12-11 DIAGNOSIS — E1165 Type 2 diabetes mellitus with hyperglycemia: Secondary | ICD-10-CM

## 2021-12-11 DIAGNOSIS — Z96653 Presence of artificial knee joint, bilateral: Secondary | ICD-10-CM | POA: Diagnosis not present

## 2021-12-11 DIAGNOSIS — E669 Obesity, unspecified: Secondary | ICD-10-CM | POA: Diagnosis not present

## 2021-12-11 DIAGNOSIS — G4733 Obstructive sleep apnea (adult) (pediatric): Secondary | ICD-10-CM | POA: Diagnosis not present

## 2021-12-11 DIAGNOSIS — R7401 Elevation of levels of liver transaminase levels: Secondary | ICD-10-CM | POA: Diagnosis not present

## 2021-12-11 LAB — CBC WITH DIFFERENTIAL/PLATELET
Abs Immature Granulocytes: 0.11 10*3/uL — ABNORMAL HIGH (ref 0.00–0.07)
Basophils Absolute: 0 10*3/uL (ref 0.0–0.1)
Basophils Relative: 0 %
Eosinophils Absolute: 0.1 10*3/uL (ref 0.0–0.5)
Eosinophils Relative: 1 %
HCT: 43.4 % (ref 36.0–46.0)
Hemoglobin: 13.5 g/dL (ref 12.0–15.0)
Immature Granulocytes: 1 %
Lymphocytes Relative: 13 %
Lymphs Abs: 1.4 10*3/uL (ref 0.7–4.0)
MCH: 26.6 pg (ref 26.0–34.0)
MCHC: 31.1 g/dL (ref 30.0–36.0)
MCV: 85.4 fL (ref 80.0–100.0)
Monocytes Absolute: 0.4 10*3/uL (ref 0.1–1.0)
Monocytes Relative: 4 %
Neutro Abs: 9.4 10*3/uL — ABNORMAL HIGH (ref 1.7–7.7)
Neutrophils Relative %: 81 %
Platelets: 296 10*3/uL (ref 150–400)
RBC: 5.08 MIL/uL (ref 3.87–5.11)
RDW: 13.6 % (ref 11.5–15.5)
WBC: 11.5 10*3/uL — ABNORMAL HIGH (ref 4.0–10.5)
nRBC: 0 % (ref 0.0–0.2)

## 2021-12-11 LAB — CREATININE, SERUM
Creatinine, Ser: 0.89 mg/dL (ref 0.44–1.00)
GFR, Estimated: >60 mL/min (ref >60)

## 2021-12-11 LAB — URINALYSIS, COMPLETE (UACMP) WITH MICROSCOPIC
Bacteria, UA: NONE SEEN
Bilirubin Urine: NEGATIVE
Glucose, UA: NEGATIVE mg/dL
Hgb urine dipstick: NEGATIVE
Ketones, ur: NEGATIVE mg/dL
Leukocytes,Ua: NEGATIVE
Nitrite: NEGATIVE
Protein, ur: NEGATIVE mg/dL
Specific Gravity, Urine: 1.028 (ref 1.005–1.030)
pH: 6 (ref 5.0–8.0)

## 2021-12-11 LAB — COMPREHENSIVE METABOLIC PANEL
ALT: 104 U/L — ABNORMAL HIGH (ref 0–44)
AST: 57 U/L — ABNORMAL HIGH (ref 15–41)
Albumin: 3.7 g/dL (ref 3.5–5.0)
Alkaline Phosphatase: 84 U/L (ref 38–126)
Anion gap: 11 (ref 5–15)
BUN: 31 mg/dL — ABNORMAL HIGH (ref 8–23)
CO2: 23 mmol/L (ref 22–32)
Calcium: 9.2 mg/dL (ref 8.9–10.3)
Chloride: 103 mmol/L (ref 98–111)
Creatinine, Ser: 1.08 mg/dL — ABNORMAL HIGH (ref 0.44–1.00)
GFR, Estimated: 54 mL/min — ABNORMAL LOW (ref >60)
Glucose, Bld: 201 mg/dL — ABNORMAL HIGH (ref 70–99)
Potassium: 3.5 mmol/L (ref 3.5–5.1)
Sodium: 137 mmol/L (ref 135–145)
Total Bilirubin: 0.5 mg/dL (ref 0.3–1.2)
Total Protein: 7.2 g/dL (ref 6.5–8.1)

## 2021-12-11 LAB — CBG MONITORING, ED: Glucose-Capillary: 151 mg/dL — ABNORMAL HIGH (ref 70–99)

## 2021-12-11 LAB — CBC
HCT: 41.5 % (ref 36.0–46.0)
Hemoglobin: 12.9 g/dL (ref 12.0–15.0)
MCH: 26.7 pg (ref 26.0–34.0)
MCHC: 31.1 g/dL (ref 30.0–36.0)
MCV: 85.7 fL (ref 80.0–100.0)
Platelets: 261 10*3/uL (ref 150–400)
RBC: 4.84 MIL/uL (ref 3.87–5.11)
RDW: 13.6 % (ref 11.5–15.5)
WBC: 12.6 10*3/uL — ABNORMAL HIGH (ref 4.0–10.5)
nRBC: 0 % (ref 0.0–0.2)

## 2021-12-11 LAB — RESP PANEL BY RT-PCR (FLU A&B, COVID) ARPGX2
Influenza A by PCR: NEGATIVE
Influenza B by PCR: NEGATIVE
SARS Coronavirus 2 by RT PCR: NEGATIVE

## 2021-12-11 LAB — LACTIC ACID, PLASMA
Lactic Acid, Venous: 2.5 mmol/L (ref 0.5–1.9)
Lactic Acid, Venous: 1.3 mmol/L (ref 0.5–1.9)

## 2021-12-11 LAB — PROCALCITONIN: Procalcitonin: <0.1 ng/mL

## 2021-12-11 MED ORDER — ONDANSETRON HCL 4 MG/2ML IJ SOLN: 4.0000 mg | Freq: Four times a day (QID) | INTRAMUSCULAR | Status: DC | PRN

## 2021-12-11 MED ORDER — ACETAMINOPHEN 325 MG PO TABS: 650.0000 mg | ORAL_TABLET | Freq: Four times a day (QID) | ORAL | Status: DC | PRN

## 2021-12-11 MED ORDER — SODIUM CHLORIDE 0.9 % IV BOLUS
1000.0000 mL | Freq: Once | INTRAVENOUS | Status: AC
  Administered 2021-12-11: 1000 mL via INTRAVENOUS

## 2021-12-11 MED ORDER — INSULIN ASPART 100 UNIT/ML IJ SOLN
0.0000 [IU] | Freq: Every day | INTRAMUSCULAR | Status: DC
  Administered 2021-12-12: 3 [IU] via SUBCUTANEOUS
  Filled 2021-12-11: qty 1

## 2021-12-11 MED ORDER — ENOXAPARIN SODIUM 40 MG/0.4ML IJ SOSY
40.0000 mg | PREFILLED_SYRINGE | INTRAMUSCULAR | Status: DC
  Administered 2021-12-11 – 2021-12-12 (×2): 40 mg via SUBCUTANEOUS
  Filled 2021-12-11 (×2): qty 0.4

## 2021-12-11 MED ORDER — SODIUM CHLORIDE 0.9 % IV SOLN
1.0000 g | Freq: Once | INTRAVENOUS | Status: AC
Start: 2021-12-11 — End: 2021-12-11
  Administered 2021-12-11: 1 g via INTRAVENOUS
  Filled 2021-12-11: qty 10

## 2021-12-11 MED ORDER — IOHEXOL 300 MG/ML  SOLN
75.0000 mL | Freq: Once | INTRAMUSCULAR | Status: AC | PRN
  Administered 2021-12-11: 75 mL via INTRAVENOUS

## 2021-12-11 MED ORDER — INSULIN ASPART 100 UNIT/ML IJ SOLN
0.0000 [IU] | Freq: Three times a day (TID) | INTRAMUSCULAR | Status: DC
  Administered 2021-12-12: 2 [IU] via SUBCUTANEOUS
  Administered 2021-12-12 (×2): 3 [IU] via SUBCUTANEOUS
  Administered 2021-12-13: 2 [IU] via SUBCUTANEOUS
  Filled 2021-12-11 (×4): qty 1

## 2021-12-11 MED ORDER — ACETAMINOPHEN 650 MG RE SUPP: 650.0000 mg | Freq: Four times a day (QID) | RECTAL | Status: DC | PRN

## 2021-12-11 MED ORDER — ONDANSETRON HCL 4 MG PO TABS: 4.0000 mg | ORAL_TABLET | Freq: Four times a day (QID) | ORAL | Status: DC | PRN

## 2021-12-11 NOTE — ED Triage Notes (Signed)
Pt coming from home with 3 weeks of cough, cold and nasal congestion. Pt was seen by pcp last week and was told they have a uri. Pt was given cough meds, and abx.   Pt arrives with nasal congestion and cough.

## 2021-12-11 NOTE — ED Provider Notes (Signed)
[facility] Provider Note  Patient Contact: 5:43 PM (approximate)   History   Cough and Nasal Congestion   HPI  Sabrina Arroyo is a 71 y.o. female with a history of diabetes, depression and sleep apnea, presents to the emergency department with 3 weeks of cough, congestion and generalized malaise at home/weakness.  Patient states that she has been treated with cefdinir after patient had findings concerning for pneumonia on chest x-ray.  She denies current shortness of breath.  She states that she has had no vomiting or abdominal pain.  States that she has never been sick with similar symptoms for this long.     Physical Exam   Triage Vital Signs: ED Triage Vitals  Enc Vitals Group     BP 12/11/21 1631 117/86     Pulse Rate 12/11/21 1631 (!) 102     Resp 12/11/21 1631 18     Temp 12/11/21 1631 98.6 F (37 C)     Temp Source 12/11/21 1631 Oral     SpO2 12/11/21 1631 95 %     Weight 12/11/21 1632 195 lb (88.5 kg)     Height --      Head Circumference --      Peak Flow --      Pain Score 12/11/21 1631 0     Pain Loc --      Pain Edu? --      Excl. in [place]? --     Most recent vital signs: Vitals:   12/11/21 1631  BP: 117/86  Pulse: (!) 102  Resp: 18  Temp: 98.6 F (37 C)  SpO2: 95%     General: Alert and in no acute distress. Eyes:  PERRL. EOMI. Head: No acute traumatic findings ENT:      Nose: No congestion/rhinnorhea.      Mouth/Throat: Mucous membranes are moist. Neck: No stridor. No cervical spine tenderness to palpation. Cardiovascular:  Good peripheral perfusion Respiratory: Normal respiratory effort without tachypnea or retractions. Lungs CTAB. Good air entry to the bases with no decreased or absent breath sounds. Gastrointestinal: Bowel sounds 4 quadrants. Soft and nontender to palpation. No guarding or rigidity. No palpable masses. No distention. No CVA tenderness. Musculoskeletal: Full range of motion to all extremities.   Neurologic:  No gross focal neurologic deficits are appreciated.  Skin:   No rash noted Other:   ED Results / Procedures / Treatments   Labs (all labs ordered are listed, but only abnormal results are displayed) Labs Reviewed  COMPREHENSIVE METABOLIC PANEL - Abnormal; Notable for the following components:      Result Value   Glucose, Bld 201 (*)    BUN 31 (*)    Creatinine, Ser 1.08 (*)    AST 57 (*)    ALT 104 (*)    GFR, Estimated 54 (*)    All other components within normal limits  CBC WITH DIFFERENTIAL/PLATELET - Abnormal; Notable for the following components:   WBC 11.5 (*)    Neutro Abs 9.4 (*)    Abs Immature Granulocytes 0.11 (*)    All other components within normal limits  LACTIC ACID, PLASMA - Abnormal; Notable for the following components:   Lactic Acid, Venous 2.5 (*)    All other components within normal limits  RESP PANEL BY RT-PCR (FLU A&B, COVID) ARPGX2  CULTURE, BLOOD (ROUTINE X 2)  CULTURE, BLOOD (ROUTINE X 2)  RESPIRATORY PANEL BY PCR  LACTIC ACID, PLASMA  PROCALCITONIN  PROCALCITONIN  HEPATITIS  PANEL, ACUTE  URINALYSIS, COMPLETE (UACMP) WITH MICROSCOPIC       RADIOLOGY  I personally viewed and evaluated these images as part of my medical decision making, as well as reviewing the written report by the radiologist.  ED Provider Interpretation: No findings concerning for pneumonia on chest x-ray.   PROCEDURES:  Critical Care performed: No  Procedures   MEDICATIONS ORDERED IN ED: Medications  sodium chloride 0.9 % bolus 1,000 mL (1,000 mLs Intravenous New Bag/Given 12/11/21 1820)  cefTRIAXone (ROCEPHIN) 1 g in sodium chloride 0.9 % 100 mL IVPB (0 g Intravenous Stopped 12/11/21 1949)  iohexol (OMNIPAQUE) 300 MG/ML solution 75 mL (75 mLs Intravenous Contrast Given 12/11/21 1808)     IMPRESSION / MDM / ASSESSMENT AND PLAN / ED COURSE  I reviewed the triage vital signs and the nursing notes.                              Assessment and  plan Cough Weakness 71 year old female presents to the emergency department with cough for the past 3 weeks.  Patient was tachycardic at triage but vital signs were otherwise reassuring.  She was satting at 95% on room air.  Patient had elevated lactic at 2-1/2.  White blood cell count 11.5 with left shift.  Patient's creatinine is elevated from her baseline at 1.08 from 0.9.  Procalcitonin in process as well as COVID and flu test.  Patient was given normal saline bolus and IV Rocephin in the emergency department.  CT chest in process.  CT chest shows no acute abnormality.  Given 3 weeks of symptoms with recent chest x-ray findings concerning for pneumonia, persistent cough, weakness and chills, will admit for IV antibiotics and observation.  Patient was given normal saline bolus and IV Rocephin in the emergency department.  Patient was excepted to the hospitalist service under the care of Dr. Para March.    Clinical Course as of 12/11/21 2048  Thu Dec 11, 2021  2012 MCV: 85.4 [JW]    Clinical Course User Index [JW] Orvil Feil, [place]     FINAL CLINICAL IMPRESSION(S) / ED DIAGNOSES   Final diagnoses:  Community acquired pneumonia, unspecified laterality     Rx / DC Orders   ED Discharge Orders     None        Note:  This document was prepared using Dragon voice recognition software and may include unintentional dictation errors.   Pia Mau [place], PA-C 12/11/21 2048    Pilar Jarvis, MD 12/12/21 [phone]

## 2021-12-11 NOTE — Assessment & Plan Note (Addendum)
Generalized weakness Patient with respiratory tract symptoms for over a week associated with generalized malaise and poor oral intake Outpatient CXR from 10/6 reportedly showed pneumonia.  Chest CT today is negative Suspecting viral respiratory tract illness Had borderline SIRS criteria with mild tachycardia of 102, leukocytosis and lactic acid 2.5, which normalized with IV fluid bolus Follow respiratory viral panel We will hold off on antibiotics.  She received a dose of ceftriaxone in the ED Antitussives, decongestants and supportive care

## 2021-12-11 NOTE — Assessment & Plan Note (Signed)
CPAP if desired 

## 2021-12-11 NOTE — ED Provider Triage Note (Signed)
Emergency Medicine Provider Triage Evaluation Note  Sabrina Arroyo , a 71 y.o. female  was evaluated in triage.  Pt complains of cough, chest congestion, x 3 weeks. Patient has been to urgent care twice and positive chest xray for pneumonia. Has been on inhaler, steroids, antibiotics, cough medications and still worsening. No shortness of breath and no chest pain  Review of Systems  Positive: Cough, chest congestion Negative: Fever, chest pain, shortness of breath  Physical Exam  There were no vitals taken for this visit. Gen:   Awake, no distress   Resp:  Normal effort  MSK:   Moves extremities without difficulty  Other:    Medical Decision Making  Medically screening exam initiated at 4:28 PM.  Appropriate orders placed.  Sabrina Arroyo was informed that the remainder of the evaluation will be completed by another provider, this initial triage assessment does not replace that evaluation, and the importance of remaining in the ED until their evaluation is complete.  Patient will have labs, chest xray at this time   Sabrina Arroyo 12/11/21 1635

## 2021-12-11 NOTE — H&P (Signed)
History and Physical    Patient: Sabrina Arroyo DOB: 01/20/1951 DOA: 12/11/2021 DOS: the patient was seen and examined on 12/11/2021 PCP: Deeann Cree, MD  Patient coming from: Home  Chief Complaint:  Chief Complaint  Patient presents with   Cough   Nasal Congestion    HPI: Sabrina Arroyo is a 71 y.o. female with medical history significant for Type 2 diabetes, obesity, HLD, sleep apnea, depression, history of gallstone pancreatitis in January 2023 s/p cholecystectomy, who recently completed a course of cefdinir for pneumonia seen on chest x-ray on 10/6 who presents to theED ED with persistent cough cold and nasal congestion associated with generalized malaise, weakness and poor oral intake.  She feels no better since the antibiotics.  She denies vomiting, diarrhea or abdominal pain.  Denies chest pain, fever or chills. ED course and data review: Afebrile but tachycardic to 102 on arrival, respirations 18 with O2 sat 95% on room air.  WBC 11,500 with procalcitonin less than 0.10 and lactic acid 2.5-1.3.  Glucose 201, creatinine 1.08 above baseline of 1.  Transaminase elevation noted with AST 57 and ALT 104 above baseline in the 30s on 9/28.  COVID and flu negative.  Respiratory viral panel pending.  CT chest with contrast was normal.  Patient was treated with ceftriaxone for possible pneumonia.  Hospitalist consulted for admission due to protracted malaise and poor improvement with outpatient management.   Review of Systems: As mentioned in the history of present illness. All other systems reviewed and are negative.  Past Medical History:  Diagnosis Date   Arthritis    Depression    Diabetes mellitus without complication ([place])    Sleep apnea    Past Surgical History:  Procedure Laterality Date   ABDOMINAL HYSTERECTOMY     BREAST SURGERY     does not remember which breast   CHOLECYSTECTOMY N/A 03/27/2021   Procedure: LAPAROSCOPIC CHOLECYSTECTOMY;   Surgeon: Armandina Gemma, MD;  Location: [facility];  Service: General;  Laterality: N/A;   CLAVICLE SURGERY Left    ENDOSCOPIC RETROGRADE CHOLANGIOPANCREATOGRAPHY (ERCP) WITH PROPOFOL N/A 03/26/2021   Procedure: ENDOSCOPIC RETROGRADE CHOLANGIOPANCREATOGRAPHY (ERCP) WITH PROPOFOL;  Surgeon: Ronnette Juniper, MD;  Location: [facility];  Service: Gastroenterology;  Laterality: N/A;   JOINT REPLACEMENT Right    Knee Replacement   REMOVAL OF STONES  03/26/2021   Procedure: REMOVAL OF STONES;  Surgeon: Ronnette Juniper, MD;  Location: [facility];  Service: Gastroenterology;;   Joan Mayans  03/26/2021   Procedure: Joan Mayans;  Surgeon: Ronnette Juniper, MD;  Location: [facility];  Service: Gastroenterology;;   TOTAL KNEE ARTHROPLASTY Left 12/31/2020   Procedure: TOTAL KNEE ARTHROPLASTY;  Surgeon: Hessie Knows, MD;  Location: [facility] ORS;  Service: Orthopedics;  Laterality: Left;   Social History:  reports that she has never smoked. She has never used smokeless tobacco. She reports that she does not currently use alcohol. She reports that she does not use drugs.  Allergies  Allergen Reactions   Codeine Itching and Rash    No family history on file.  Prior to Admission medications   Medication Sig Start Date End Date Taking? Authorizing Provider  acetaminophen (TYLENOL) 500 MG tablet Take 1 tablet (500 mg total) by mouth every 6 (six) hours as needed for mild pain, fever or headache. 03/28/21   Georgette Shell, MD  amphetamine-dextroamphetamine (ADDERALL XR) 20 MG 24 hr capsule Take 20 mg by mouth daily. 10/25/19   [provider]  ARIPiprazole (ABILIFY) 5 MG tablet Take 5 mg  by mouth at bedtime. 11/17/20   [provider]  aspirin 81 MG EC tablet Take 81 mg by mouth daily. 11/11/16   [provider]  metFORMIN (GLUCOPHAGE-XR) 500 MG 24 hr tablet Take 1,000 mg by mouth 2 (two) times daily. 11/27/20   [provider]  Multiple Vitamins-Minerals (MULTIVITAMIN WITH MINERALS)  tablet Take 1 tablet by mouth daily. Woman    [provider]  omeprazole (PRILOSEC) 20 MG capsule Take 20 mg by mouth at bedtime. 11/05/20   [provider]  ondansetron (ZOFRAN) 4 MG tablet Take 1 tablet (4 mg total) by mouth daily as needed for nausea or vomiting. 03/19/21 03/19/22  Georga Hacking, MD  pramipexole (MIRAPEX) 0.5 MG tablet Take 0.5 mg by mouth at bedtime. 07/18/20   [provider]  rosuvastatin (CRESTOR) 40 MG tablet Take 40 mg by mouth at bedtime. 11/27/20   [provider]  vortioxetine HBr (TRINTELLIX) 20 MG TABS tablet Take 20 mg by mouth at bedtime. 12/03/17   [provider]    Physical Exam: Vitals:   12/11/21 1631 12/11/21 1632 12/11/21 1758  BP: 117/86    Pulse: (!) 102    Resp: 18    Temp: 98.6 F (37 C)    TempSrc: Oral    SpO2: 95%    Weight:  88.5 kg 88.5 kg  Height:   5\' 5"  (1.651 m)   Physical Exam Vitals and nursing note reviewed.  Constitutional:      General: She is not in acute distress. HENT:     Head: Normocephalic and atraumatic.     Nose: Congestion present.  Cardiovascular:     Rate and Rhythm: Normal rate and regular rhythm.     Heart sounds: Normal heart sounds.  Pulmonary:     Effort: Pulmonary effort is normal.     Breath sounds: Normal breath sounds. Transmitted upper airway sounds present.  Abdominal:     Palpations: Abdomen is soft.     Tenderness: There is no abdominal tenderness.  Neurological:     Mental Status: Mental status is at baseline.     Labs on Admission: I have personally reviewed following labs and imaging studies  CBC: Recent Labs  Lab 12/11/21 1636  WBC 11.5*  NEUTROABS 9.4*  HGB 13.5  HCT 43.4  MCV 85.4  PLT 296   Basic Metabolic Panel: Recent Labs  Lab 12/11/21 1636  NA 137  K 3.5  CL 103  CO2 23  GLUCOSE 201*  BUN 31*  CREATININE 1.08*  CALCIUM 9.2   GFR: Estimated Creatinine Clearance: 51 mL/min (A) (by C-G formula based on SCr of 1.08  mg/dL (H)). Liver Function Tests: Recent Labs  Lab 12/11/21 1636  AST 57*  ALT 104*  ALKPHOS 84  BILITOT 0.5  PROT 7.2  ALBUMIN 3.7   No results for input(s): "LIPASE", "AMYLASE" in the last 168 hours. No results for input(s): "AMMONIA" in the last 168 hours. Coagulation Profile: No results for input(s): "INR", "PROTIME" in the last 168 hours. Cardiac Enzymes: No results for input(s): "CKTOTAL", "CKMB", "CKMBINDEX", "TROPONINI" in the last 168 hours. BNP (last 3 results) No results for input(s): "PROBNP" in the last 8760 hours. HbA1C: No results for input(s): "HGBA1C" in the last 72 hours. CBG: No results for input(s): "GLUCAP" in the last 168 hours. Lipid Profile: No results for input(s): "CHOL", "HDL", "LDLCALC", "TRIG", "CHOLHDL", "LDLDIRECT" in the last 72 hours. Thyroid Function Tests: No results for input(s): "TSH", "T4TOTAL", "FREET4", "T3FREE", "  THYROIDAB" in the last 72 hours. Anemia Panel: No results for input(s): "VITAMINB12", "FOLATE", "FERRITIN", "TIBC", "IRON", "RETICCTPCT" in the last 72 hours. Urine analysis:    Component Value Date/Time   COLORURINE AMBER (A) 03/23/2021 2051   APPEARANCEUR HAZY (A) 03/23/2021 2051   LABSPEC 1.014 03/23/2021 2051   PHURINE 6.0 03/23/2021 2051   GLUCOSEU NEGATIVE 03/23/2021 2051   HGBUR NEGATIVE 03/23/2021 2051   BILIRUBINUR NEGATIVE 03/23/2021 2051   KETONESUR 20 (A) 03/23/2021 2051   PROTEINUR 30 (A) 03/23/2021 2051   NITRITE NEGATIVE 03/23/2021 2051   LEUKOCYTESUR NEGATIVE 03/23/2021 2051    Radiological Exams on Admission: CT Chest W Contrast  Result Date: 12/11/2021 CLINICAL DATA:  Cough, chest congestion EXAM: CT CHEST WITH CONTRAST TECHNIQUE: Multidetector CT imaging of the chest was performed during intravenous contrast administration. RADIATION DOSE REDUCTION: This exam was performed according to the departmental dose-optimization program which includes automated exposure control, adjustment of the mA and/or  kV according to patient size and/or use of iterative reconstruction technique. CONTRAST:  11mL OMNIPAQUE IOHEXOL 300 MG/ML  SOLN COMPARISON:  Chest radiograph dated 12/11/2021 FINDINGS: Cardiovascular: The heart is normal in size. No pericardial effusion. No evidence of thoracic aortic aneurysm. Mild atherosclerotic calcifications of the arch. Mild three-vessel coronary atherosclerosis. Mediastinum/Nodes: No suspicious mediastinal lymphadenopathy. Visualized thyroid is unremarkable. Lungs/Pleura: Lungs are clear. No focal consolidation. No suspicious pulmonary nodules. No pleural effusion or pneumothorax. Upper Abdomen: Visualized upper abdomen is notable for prior cholecystectomy and vascular calcifications. Musculoskeletal: Degenerative changes of the visualized thoracolumbar spine. IMPRESSION: Normal CT chest. Aortic Atherosclerosis (ICD10-I70.0). Electronically Signed   By: Charline Bills M.D.   On: 12/11/2021 18:32   DG Chest 2 View  Result Date: 12/11/2021 CLINICAL DATA:  cough, congestion EXAM: CHEST - 2 VIEW COMPARISON:  CT chest abdomen/pelvis 02/07/2021. FINDINGS: The heart size and mediastinal contours are within normal limits. Both lungs are clear. No visible pleural effusions or pneumothorax. No acute osseous abnormality. Right upper quadrant clips. Polyarticular degenerative change. Right humeral head suture anchors. IMPRESSION: No active cardiopulmonary disease. Electronically Signed   By: Feliberto Harts M.D.   On: 12/11/2021 16:50     Data Reviewed: Relevant notes from primary care and specialist visits, past discharge summaries as available in EHR, including Care Everywhere. Prior diagnostic testing as pertinent to current admission diagnoses Updated medications and problem lists for reconciliation ED course, including vitals, labs, imaging, treatment and response to treatment Triage notes, nursing and pharmacy notes and ED provider's notes Notable results as noted in  HPI   Assessment and Plan: * Respiratory tract infection Generalized weakness Patient with respiratory tract symptoms for over a week associated with generalized malaise and poor oral intake Outpatient CXR from 10/6 reportedly showed pneumonia.  Chest CT today is negative Suspecting viral respiratory tract illness Had borderline SIRS criteria with mild tachycardia of 102, leukocytosis and lactic acid 2.5, which normalized with IV fluid bolus Follow respiratory viral panel We will hold off on antibiotics.  She received a dose of ceftriaxone in the ED Antitussives, decongestants and supportive care   Transaminitis Patient is s/p cholecystectomy for gallstone pancreatitis in January 2023 We will get viral hepatitis panel Monitor for improvement with hydration  Uncontrolled type 2 diabetes mellitus with hyperglycemia, without long-term current use of insulin (HCC) Blood sugar 201 Sliding scale insulin coverage  Depression Continue Trintellix  OSA on CPAP CPAP if desired        DVT prophylaxis: Lovenox  Consults: none  Advance Care Planning:  Code Status: Prior   Family Communication: none  Disposition Plan: Back to previous home environment  Severity of Illness: The appropriate patient status for this patient is OBSERVATION. Observation status is judged to be reasonable and necessary in order to provide the required intensity of service to ensure the patient's safety. The patient's presenting symptoms, physical exam findings, and initial radiographic and laboratory data in the context of their medical condition is felt to place them at decreased risk for further clinical deterioration. Furthermore, it is anticipated that the patient will be medically stable for discharge from the hospital within 2 midnights of admission.   Author: Andris Baumann, MD 12/11/2021 8:50 PM  For on call review [email or web address].

## 2021-12-11 NOTE — Assessment & Plan Note (Signed)
Blood sugar 201 Sliding scale insulin coverage

## 2021-12-11 NOTE — Assessment & Plan Note (Signed)
Patient is s/p cholecystectomy for gallstone pancreatitis in January 2023 We will get viral hepatitis panel Monitor for improvement with hydration

## 2021-12-11 NOTE — Assessment & Plan Note (Signed)
Continue Trintellix.

## 2021-12-11 NOTE — ED Notes (Signed)
Pt not in lobby, BR, or outside at this time

## 2021-12-12 DIAGNOSIS — J988 Other specified respiratory disorders: Secondary | ICD-10-CM | POA: Diagnosis not present

## 2021-12-12 LAB — CBC
HCT: 39.3 % (ref 36.0–46.0)
Hemoglobin: 12.7 g/dL (ref 12.0–15.0)
MCH: 27.4 pg (ref 26.0–34.0)
MCHC: 32.3 g/dL (ref 30.0–36.0)
MCV: 84.7 fL (ref 80.0–100.0)
Platelets: 228 10*3/uL (ref 150–400)
RBC: 4.64 MIL/uL (ref 3.87–5.11)
RDW: 13.5 % (ref 11.5–15.5)
WBC: 11.4 10*3/uL — ABNORMAL HIGH (ref 4.0–10.5)
nRBC: 0 % (ref 0.0–0.2)

## 2021-12-12 LAB — RESPIRATORY PANEL BY PCR

## 2021-12-12 LAB — COMPREHENSIVE METABOLIC PANEL WITH GFR
ALT: 85 U/L — ABNORMAL HIGH (ref 0–44)
AST: 39 U/L (ref 15–41)
Albumin: 3.2 g/dL — ABNORMAL LOW (ref 3.5–5.0)
Alkaline Phosphatase: 72 U/L (ref 38–126)
Anion gap: 6 (ref 5–15)
BUN: 24 mg/dL — ABNORMAL HIGH (ref 8–23)
CO2: 26 mmol/L (ref 22–32)
Calcium: 8.6 mg/dL — ABNORMAL LOW (ref 8.9–10.3)
Chloride: 108 mmol/L (ref 98–111)
Creatinine, Ser: 0.81 mg/dL (ref 0.44–1.00)
GFR, Estimated: >60 mL/min
Glucose, Bld: 152 mg/dL — ABNORMAL HIGH (ref 70–99)
Potassium: 3.4 mmol/L — ABNORMAL LOW (ref 3.5–5.1)
Sodium: 140 mmol/L (ref 135–145)
Total Bilirubin: 0.4 mg/dL (ref 0.3–1.2)
Total Protein: 6.5 g/dL (ref 6.5–8.1)

## 2021-12-12 LAB — HEPATITIS PANEL, ACUTE
HCV Ab: NONREACTIVE
Hep A IgM: NONREACTIVE
Hep B C IgM: NONREACTIVE
Hepatitis B Surface Ag: NONREACTIVE

## 2021-12-12 LAB — GLUCOSE, CAPILLARY
Glucose-Capillary: 167 mg/dL — ABNORMAL HIGH (ref 70–99)
Glucose-Capillary: 125 mg/dL — ABNORMAL HIGH (ref 70–99)
Glucose-Capillary: 191 mg/dL — ABNORMAL HIGH (ref 70–99)
Glucose-Capillary: 281 mg/dL — ABNORMAL HIGH (ref 70–99)

## 2021-12-12 LAB — HEMOGLOBIN A1C
Hgb A1c MFr Bld: 6.7 % — ABNORMAL HIGH (ref 4.8–5.6)
Mean Plasma Glucose: 145.59 mg/dL

## 2021-12-12 LAB — PROCALCITONIN: Procalcitonin: <0.1 ng/mL

## 2021-12-12 MED ORDER — METHYLPREDNISOLONE SODIUM SUCC 40 MG IJ SOLR
40.0000 mg | Freq: Every day | INTRAMUSCULAR | Status: DC
  Administered 2021-12-12 – 2021-12-13 (×2): 40 mg via INTRAVENOUS
  Filled 2021-12-12 (×2): qty 1

## 2021-12-12 MED ORDER — HYDROCODONE BIT-HOMATROP MBR 5-1.5 MG/5ML PO SOLN: 5.0000 mL | Freq: Four times a day (QID) | ORAL | Status: DC | PRN

## 2021-12-12 MED ORDER — BUDESONIDE 0.25 MG/2ML IN SUSP
0.2500 mg | Freq: Two times a day (BID) | RESPIRATORY_TRACT | Status: DC
  Administered 2021-12-12 – 2021-12-13 (×3): 0.25 mg via RESPIRATORY_TRACT
  Filled 2021-12-12 (×3): qty 2

## 2021-12-12 MED ORDER — LORATADINE 10 MG PO TABS
10.0000 mg | ORAL_TABLET | Freq: Every day | ORAL | Status: DC
  Administered 2021-12-12 – 2021-12-13 (×2): 10 mg via ORAL
  Filled 2021-12-12 (×2): qty 1

## 2021-12-12 MED ORDER — POTASSIUM CHLORIDE CRYS ER 20 MEQ PO TBCR
40.0000 meq | EXTENDED_RELEASE_TABLET | Freq: Once | ORAL | Status: AC
  Administered 2021-12-12: 40 meq via ORAL
  Filled 2021-12-12: qty 2

## 2021-12-12 MED ORDER — HYDROCOD POLI-CHLORPHE POLI ER 10-8 MG/5ML PO SUER
5.0000 mL | Freq: Two times a day (BID) | ORAL | Status: DC | PRN
  Administered 2021-12-12: 5 mL via ORAL
  Filled 2021-12-12: qty 5

## 2021-12-12 MED ORDER — FLUTICASONE PROPIONATE 50 MCG/ACT NA SUSP
2.0000 | Freq: Every day | NASAL | Status: DC
  Administered 2021-12-12 – 2021-12-13 (×2): 2 via NASAL
  Filled 2021-12-12: qty 16

## 2021-12-12 MED ORDER — GUAIFENESIN ER 600 MG PO TB12
600.0000 mg | ORAL_TABLET | Freq: Two times a day (BID) | ORAL | Status: DC
  Administered 2021-12-12 – 2021-12-13 (×2): 600 mg via ORAL
  Filled 2021-12-12 (×3): qty 1

## 2021-12-12 NOTE — Plan of Care (Signed)
  Problem: Activity: Goal: Ability to tolerate increased activity will improve Outcome: Progressing   Problem: Clinical Measurements: Goal: Ability to maintain a body temperature in the normal range will improve Outcome: Progressing   Problem: Respiratory: Goal: Ability to maintain adequate ventilation will improve Outcome: Progressing   

## 2021-12-12 NOTE — Hospital Course (Addendum)
71 y.o.f w/ T2DM, obesity class I,HLD, sleep apnea, depression, history of gallstone pancreatitis in January 2023 s/p cholecystectomy, who recently completed a course of cefdinir for pneumonia seen on chest x-ray on 10/6  Presents with persistent cough cold nasal congestion generalized malaise weakness poor oral intake, did not feel better since the antibiotics. In the ED -Afebrile but tachycardic to 102 on arrival, respirations 18 with O2 sat 95% on room air.  WBC 11,500 with procalcitonin less than 0.10 and lactic acid 2.5-1.3.  Glucose 201, creatinine 1.08 above baseline of 1. Transaminase elevation noted with AST 57 and ALT 104 above baseline in the 30s on 9/28.  COVID and flu negative.  Respiratory viral panel pending.  CT chest with contrast was normal.   Patient was given ceftriaxone and was admitted.

## 2021-12-12 NOTE — Progress Notes (Signed)
PROGRESS NOTE Sabrina Arroyo  EHM:[id number] DOB: [birth year] DOA: 12/11/2021 PCP: Allyne Gee, MD   Brief Narrative/Hospital Course: 71 y.o.f w/ T2DM, obesity class I,HLD, sleep apnea, depression, history of gallstone pancreatitis in January 2023 s/p cholecystectomy, who recently completed a course of cefdinir for pneumonia seen on chest x-ray on 10/6  Presents with persistent cough cold nasal congestion generalized malaise weakness poor oral intake, did not feel better since the antibiotics. In the ED -Afebrile but tachycardic to 102 on arrival, respirations 18 with O2 sat 95% on room air.  WBC 11,500 with procalcitonin less than 0.10 and lactic acid 2.5-1.3.  Glucose 201, creatinine 1.08 above baseline of 1. Transaminase elevation noted with AST 57 and ALT 104 above baseline in the 30s on 9/28.  COVID and flu negative.  Respiratory viral panel pending.  CT chest with contrast was normal.   Patient was given ceftriaxone and was admitted.      Subjective: Seen th\is am just back from restroom Still coughing - dry, wheezing- having symptom since 2 and half week  No fever. Had temp in pcp office She finished 2 round of antibiotics and still has 2-3 left form last one1 Took steroid shot at pcp office Was taking methylprednisolone PTA  Assessment and Plan: Principal Problem:   Respiratory tract infection Active Problems:   Transaminitis   Uncontrolled type 2 diabetes mellitus with hyperglycemia, without long-term current use of insulin (HCC)   Generalized weakness   OSA on CPAP   Depression   Suspected respiratory tract infection with Nasal congestion/malaise Mild leukocytosis: CT chest unrevealing procalcitonin negative.  Mild leukocytosis tachycardia with SIRS criteria.  COVID-19 and influenza negative.  With procalcitonin percent being negative and no obvious bacterial infection holding off on further antibiotics.  Follow-up respiratory virus panel continue hydration IV  fluids supportive care antitussives- add tussinex, cont Tylenol, add antihistaminic decongestant, IV steroids, given patient's extensive expiratory wheezing. Mild leukocytosis likely from recent steroid use.Continue current supportive care. Recent Labs  Lab 12/11/21 1636 12/11/21 1950 12/11/21 2149 12/12/21 0429  WBC 11.5*  --  12.6* 11.4*  LATICACIDVEN 2.5* 1.3  --   --   PROCALCITON <0.10  --   --  <0.10     Mild lactic acidosis: resolved. Hypokalemia: Replete po.  Transaminitis Recent cholecystectomy: Negative acute viral hepatitis panel continue hydration and trend.  Overall improving  Diabetes mellitus A1c 6.7.  Blood sugar control, continue SSI Recent Labs  Lab 12/11/21 2149 12/11/21 2202 12/12/21 0811 12/12/21 1144  GLUCAP  --  151* 167* 125*  HGBA1C 6.7*  --   --   --     OSA on CPAP Depression mood stable on home meds Class I obesity BMI 32 will benefit with weight loss PC follow-up   DVT prophylaxis: enoxaparin (LOVENOX) injection 40 mg Start: 12/11/21 2200 Code Status:   Code Status: Full Code Family Communication: plan of care discussed with patient at bedside. Patient status is: Observation but remains hospitalized due to ongoing symptomatology of shortness of breath  Level of care: Med-Surg  Dispo: The patient is from: HOME            Anticipated disposition: HOME once stable,likely  tomorrow.  Mobility Assessment (last 72 hours)     Mobility Assessment     Row Name 12/12/21 0046           Does patient have an order for bedrest or is patient medically unstable No - Continue assessment  What is the highest level of mobility based on the progressive mobility assessment? Level 6 (Walks independently in room and hall) - Balance while walking in room without assist - Complete                 Objective: Vitals last 24 hrs: Vitals:   12/11/21 2341 12/12/21 0043 12/12/21 0537 12/12/21 0809  BP: 125/69 135/70 117/71 97/74  Pulse: 67 85 (!)  57 64  Resp: 18 20 18 18   Temp: 98.5 F (36.9 C) 98.5 F (36.9 C) 98.5 F (36.9 C) 98.6 F (37 C)  TempSrc: Oral Oral Oral Oral  SpO2: 95% 99% 99% 99%  Weight:      Height:       Weight change:   Physical Examination:  General exam: alert awake, older than stated age HEENT:Oral mucosa moist, Ear/Nose WNL grossly Respiratory system: bilaterally air entry + with expiratory wheezing. no use of accessory muscle Cardiovascular system: S1 & S2 +, No JVD. Gastrointestinal system: Abdomen soft,NT,ND, BS+ Nervous System:Alert, awake, moving extremities. Extremities: LE edema neg,distal peripheral pulses palpable.  Skin: No rashes,no icterus. MSK: Normal muscle bulk,tone, power  Medications reviewed:  Scheduled Meds:  enoxaparin (LOVENOX) injection  40 mg Subcutaneous Q24H   fluticasone  2 spray Each Nare Daily   insulin aspart  0-15 Units Subcutaneous TID WC   insulin aspart  0-5 Units Subcutaneous QHS   loratadine  10 mg Oral Daily   Continuous Infusions:    Diet Order             Diet heart healthy/carb modified Room service appropriate? Yes; Fluid consistency: Thin  Diet effective now                     Intake/Output Summary (Last 24 hours) at 12/12/2021 0956 Last data filed at 12/12/2021 0540 Gross per 24 hour  Intake 240 ml  Output --  Net 240 ml   Net IO Since Admission: 240 mL [12/12/21 0956]  Wt Readings from Last 3 Encounters:  12/11/21 88.5 kg  03/26/21 84.8 kg  03/18/21 86.2 kg     Unresulted Labs (From admission, onward)     Start     Ordered   12/18/21 0500  Creatinine, serum  (enoxaparin (LOVENOX)    CrCl >/= 30 ml/min)  Weekly,   R     Comments: while on enoxaparin therapy    12/11/21 2058   12/13/21 3785  Basic metabolic panel  Tomorrow morning,   R        12/12/21 0810   12/13/21 0500  CBC  Tomorrow morning,   R        12/12/21 0810   12/12/21 0500  Procalcitonin  Daily at 5am,   URGENT      12/11/21 1739          Data  Reviewed: I have personally reviewed following labs and imaging studies CBC: Recent Labs  Lab 12/11/21 1636 12/11/21 2149 12/12/21 0429  WBC 11.5* 12.6* 11.4*  NEUTROABS 9.4*  --   --   HGB 13.5 12.9 12.7  HCT 43.4 41.5 39.3  MCV 85.4 85.7 84.7  PLT 296 261 885   Basic Metabolic Panel: Recent Labs  Lab 12/11/21 1636 12/11/21 2149 12/12/21 0429  NA 137  --  140  K 3.5  --  3.4*  CL 103  --  108  CO2 23  --  26  GLUCOSE 201*  --  152*  BUN 31*  --  24*  CREATININE 1.08* 0.89 0.81  CALCIUM 9.2  --  8.6*   GFR: Estimated Creatinine Clearance: 68 mL/min (by C-G formula based on SCr of 0.81 mg/dL). Liver Function Tests: Recent Labs  Lab 12/11/21 1636 12/12/21 0429  AST 57* 39  ALT 104* 85*  ALKPHOS 84 72  BILITOT 0.5 0.4  PROT 7.2 6.5  ALBUMIN 3.7 3.2*   No results for input(s): "LIPASE", "AMYLASE" in the last 168 hours. No results for input(s): "AMMONIA" in the last 168 hours. Coagulation Profile: No results for input(s): "INR", "PROTIME" in the last 168 hours. BNP (last 3 results) No results for input(s): "PROBNP" in the last 8760 hours. HbA1C: Recent Labs    12/11/21 2149  HGBA1C 6.7*   CBG: Recent Labs  Lab 12/11/21 2202 12/12/21 0811  GLUCAP 151* 167*   Lipid Profile: No results for input(s): "CHOL", "HDL", "LDLCALC", "TRIG", "CHOLHDL", "LDLDIRECT" in the last 72 hours. Thyroid Function Tests: No results for input(s): "TSH", "T4TOTAL", "FREET4", "T3FREE", "THYROIDAB" in the last 72 hours. Sepsis Labs: Recent Labs  Lab 12/11/21 1636 12/11/21 1950 12/12/21 0429  PROCALCITON <0.10  --  <0.10  LATICACIDVEN 2.5* 1.3  --     Recent Results (from the past 240 hour(s))  Resp Panel by RT-PCR (Flu A&B, Covid) Anterior Nasal Swab     Status: None   Collection Time: 12/11/21  4:36 PM   Specimen: Anterior Nasal Swab  Result Value Ref Range Status   SARS Coronavirus 2 by RT PCR NEGATIVE NEGATIVE Final    Comment: (NOTE) SARS-CoV-2 target nucleic  acids are NOT DETECTED.  The SARS-CoV-2 RNA is generally detectable in upper respiratory specimens during the acute phase of infection. The lowest concentration of SARS-CoV-2 viral copies this assay can detect is 138 copies/mL. A negative result does not preclude SARS-Cov-2 infection and should not be used as the sole basis for treatment or other patient management decisions. A negative result may occur with  improper specimen collection/handling, submission of specimen other than nasopharyngeal swab, presence of viral mutation(s) within the areas targeted by this assay, and inadequate number of viral copies(<138 copies/mL). A negative result must be combined with clinical observations, patient history, and epidemiological information. The expected result is Negative.  Fact Sheet for Patients:  BloggerCourse.com  Fact Sheet for Healthcare Providers:  SeriousBroker.it  This test is no t yet approved or cleared by the [place] FDA and  has been authorized for detection and/or diagnosis of SARS-CoV-2 by FDA under an Emergency Use Authorization (EUA). This EUA will remain  in effect (meaning this test can be used) for the duration of the COVID-19 declaration under Section 564(b)(1) of the Act, 21 U.S.C.section 360bbb-3(b)(1), unless the authorization is terminated  or revoked sooner.       Influenza A by PCR NEGATIVE NEGATIVE Final   Influenza B by PCR NEGATIVE NEGATIVE Final    Comment: (NOTE) The Xpert Xpress SARS-CoV-2/FLU/RSV plus assay is intended as an aid in the diagnosis of influenza from Nasopharyngeal swab specimens and should not be used as a sole basis for treatment. Nasal washings and aspirates are unacceptable for Xpert Xpress SARS-CoV-2/FLU/RSV testing.  Fact Sheet for Patients: BloggerCourse.com  Fact Sheet for Healthcare Providers: SeriousBroker.it  This  test is not yet approved or cleared by the [place] FDA and has been authorized for detection and/or diagnosis of SARS-CoV-2 by FDA under an Emergency Use Authorization (EUA). This EUA will remain in effect (meaning this test can be used) for the duration  of the COVID-19 declaration under Section 564(b)(1) of the Act, 21 U.S.C. section 360bbb-3(b)(1), unless the authorization is terminated or revoked.  Performed at [facility], [address] [place], [place] [address]   Blood culture (routine x 2)     Status: None (Preliminary result)   Collection Time: 12/11/21  7:50 PM   Specimen: BLOOD  Result Value Ref Range Status   Specimen Description BLOOD LEFT ANTECUBITAL  Final   Special Requests   Final    BOTTLES DRAWN AEROBIC AND ANAEROBIC Blood Culture results may not be optimal due to an excessive volume of blood received in culture bottles   Culture   Final    NO GROWTH < 12 HOURS Performed at [facility], [address] [place], [place] [address]    Report Status PENDING  Incomplete  Blood culture (routine x 2)     Status: None (Preliminary result)   Collection Time: 12/11/21  7:50 PM   Specimen: BLOOD  Result Value Ref Range Status   Specimen Description BLOOD BLOOD LEFT FOREARM  Final   Special Requests   Final    BOTTLES DRAWN AEROBIC AND ANAEROBIC Blood Culture adequate volume   Culture   Final    NO GROWTH < 12 HOURS Performed at [facility], [address] [place], [place] [address]    Report Status PENDING  Incomplete  Respiratory (~20 pathogens) panel by PCR     Status: None   Collection Time: 12/11/21  9:49 PM   Specimen: Nasopharyngeal Swab; Respiratory  Result Value Ref Range Status   Adenovirus NOT DETECTED NOT DETECTED Final   Coronavirus 229E NOT DETECTED NOT DETECTED Final    Comment: (NOTE) The Coronavirus on the Respiratory Panel, DOES NOT test for the novel  Coronavirus (2019 nCoV)    Coronavirus HKU1 NOT  DETECTED NOT DETECTED Final   Coronavirus NL63 NOT DETECTED NOT DETECTED Final   Coronavirus OC43 NOT DETECTED NOT DETECTED Final   Metapneumovirus NOT DETECTED NOT DETECTED Final   Rhinovirus / Enterovirus NOT DETECTED NOT DETECTED Final   Influenza A NOT DETECTED NOT DETECTED Final   Influenza B NOT DETECTED NOT DETECTED Final   Parainfluenza Virus 1 NOT DETECTED NOT DETECTED Final   Parainfluenza Virus 2 NOT DETECTED NOT DETECTED Final   Parainfluenza Virus 3 NOT DETECTED NOT DETECTED Final   Parainfluenza Virus 4 NOT DETECTED NOT DETECTED Final   Respiratory Syncytial Virus NOT DETECTED NOT DETECTED Final   Bordetella pertussis NOT DETECTED NOT DETECTED Final   Bordetella Parapertussis NOT DETECTED NOT DETECTED Final   Chlamydophila pneumoniae NOT DETECTED NOT DETECTED Final   Mycoplasma pneumoniae NOT DETECTED NOT DETECTED Final    Comment: Performed at [facility], [address] [place], [place] [address]    Antimicrobials: Anti-infectives (From admission, onward)    Start     Dose/Rate Route Frequency Ordered Stop   12/11/21 1745  cefTRIAXone (ROCEPHIN) 1 g in sodium chloride 0.9 % 100 mL IVPB        1 g 200 mL/hr over 30 Minutes Intravenous  Once 12/11/21 1739 12/11/21 1949      Culture/Microbiology    Component Value Date/Time   SDES BLOOD LEFT ANTECUBITAL 12/11/2021 1950   SDES BLOOD BLOOD LEFT FOREARM 12/11/2021 1950   SPECREQUEST  12/11/2021 1950    BOTTLES DRAWN AEROBIC AND ANAEROBIC Blood Culture results may not be optimal due to an excessive volume of blood received in culture bottles   SPECREQUEST  12/11/2021 1950    BOTTLES DRAWN AEROBIC AND ANAEROBIC Blood Culture adequate volume   CULT  12/11/2021 1950    NO GROWTH < 12 HOURS Performed at [facility], [address] [place], [place] [address]    CULT  12/11/2021 1950    NO GROWTH < 12 HOURS Performed at [facility], [address] [place], [place] [address]    REPTSTATUS  PENDING 10/1[place] [phone]  REPTSTATUS [place] [id number] vascular: The heart is normal in size. No pericardial effusion. No evidence of thoracic aortic aneurysm. Mild atherosclerotic calcifications of the arch. Mild three-vessel coronary atherosclerosis. Mediastinum/Nodes: No suspicious mediastinal lymphadenopathy. Visualized thyroid is unremarkable. Lungs/Pleura: Lungs are clear. No focal consolidation. No suspicious pulmonary nodules. No pleural effusion or pneumothorax. Upper Abdomen: Visualized upper abdomen is notable for prior cholecystectomy and vascular calcifications. Musculoskeletal: Degenerative changes of the visualized thoracolumbar spine. IMPRESSION: Normal CT chest. Aortic Atherosclerosis (ICD10-I70.0). Electronically Signed   By: Sriyesh  Krishnan M.D.   On: 12/11/2021 18:32   DG Chest 2 View  Result Date: 12/11/2021 CLINICAL DATA:  cough, congestion EXAM: CHEST - 2 VIEW COMPARISON:  CT chest abdomen/pelvis 02/07/2021. FINDINGS: The heart size and mediastinal contours are within normal limits. Both lungs are clear. No visible pleural effusions or pneumothorax. No acute osseous abnormality. Right upper quadrant clips. Polyarticular degenerative change. Right humeral head suture anchors. IMPRESSION: No active cardiopulmonary disease. Electronically Signed   By: Frederick S Jones M.D.   On:  12/11/2021 16:50     LOS: 0 days   Burke Terry, MD [facility]  12/12/2021, 9:56 AM

## 2021-12-13 DIAGNOSIS — J988 Other specified respiratory disorders: Secondary | ICD-10-CM | POA: Diagnosis not present

## 2021-12-13 LAB — CBC
HCT: 37.3 % (ref 36.0–46.0)
Hemoglobin: 12.2 g/dL (ref 12.0–15.0)
MCH: 26.8 pg (ref 26.0–34.0)
MCHC: 32.7 g/dL (ref 30.0–36.0)
MCV: 81.8 fL (ref 80.0–100.0)
Platelets: 219 10*3/uL (ref 150–400)
RBC: 4.56 MIL/uL (ref 3.87–5.11)
RDW: 13.5 % (ref 11.5–15.5)
WBC: 9.8 10*3/uL (ref 4.0–10.5)
nRBC: 0 % (ref 0.0–0.2)

## 2021-12-13 LAB — BASIC METABOLIC PANEL
Anion gap: 8 (ref 5–15)
BUN: 23 mg/dL (ref 8–23)
CO2: 24 mmol/L (ref 22–32)
Calcium: 8.8 mg/dL — ABNORMAL LOW (ref 8.9–10.3)
Chloride: 105 mmol/L (ref 98–111)
Creatinine, Ser: 0.84 mg/dL (ref 0.44–1.00)
GFR, Estimated: >60 mL/min (ref >60)
Glucose, Bld: 163 mg/dL — ABNORMAL HIGH (ref 70–99)
Potassium: 4 mmol/L (ref 3.5–5.1)
Sodium: 137 mmol/L (ref 135–145)

## 2021-12-13 LAB — PROCALCITONIN: Procalcitonin: <0.1 ng/mL

## 2021-12-13 LAB — GLUCOSE, CAPILLARY: Glucose-Capillary: 136 mg/dL — ABNORMAL HIGH (ref 70–99)

## 2021-12-13 MED ORDER — LORATADINE 10 MG PO TABS: 10.0000 mg | ORAL_TABLET | Freq: Every day | ORAL | 0 refills | Status: DC

## 2021-12-13 MED ORDER — PREDNISONE 10 MG PO TABS: 20.0000 mg | ORAL_TABLET | Freq: Every day | ORAL | 0 refills | Status: AC

## 2021-12-13 MED ORDER — BENZONATATE 200 MG PO CAPS: 200.0000 mg | ORAL_CAPSULE | Freq: Three times a day (TID) | ORAL | 0 refills | Status: AC | PRN

## 2021-12-13 NOTE — Discharge Summary (Signed)
Physician Discharge Summary  Sabrina Arroyo ZOX:[id number] DOB: 29-Apr-1950 DOA: 12/11/2021  PCP: Allyne Gee, MD  Admit date: 12/11/2021 Discharge date: 12/13/2021 Recommendations for Outpatient Follow-up:  Follow up with PCP in 1 weeks-call for appointment Please obtain BMP/CBC in one week  Discharge Dispo: Home Discharge Condition: Stable Code Status:   Code Status: Full Code Diet recommendation:  Diet Order             Diet heart healthy/carb modified Room service appropriate? Yes; Fluid consistency: Thin  Diet effective now                    Brief/Interim Summary: 71 y.o.f w/ T2DM, obesity class I,HLD, sleep apnea, depression, history of gallstone pancreatitis in January 2023 s/p cholecystectomy, who recently completed a course of cefdinir for pneumonia seen on chest x-ray on 10/6  Presents with persistent cough cold nasal congestion generalized malaise weakness poor oral intake, did not feel better since the antibiotics. In the ED -Afebrile but tachycardic to 102 on arrival, respirations 18 with O2 sat 95% on room air.  WBC 11,500 with procalcitonin less than 0.10 and lactic acid 2.5-1.3.  Glucose 201, creatinine 1.08 above baseline of 1. Transaminase elevation noted with AST 57 and ALT 104 above baseline in the 30s on 9/28.  COVID and flu negative.  Respiratory viral panel pending.  CT chest was normal.   Patient was given ceftriaxone and was admitted for  further work-up further revealed no bacterial etiology respiratory virus panel was negative.  Likely reactive airway disease given his steroids bronchodilators and improved.  At this time she feels much improved and feels ready for home and is being discharged home.  Advised to follow-up with PCP may need potentially to get referral for pulmonary  as OP.     Discharge Diagnoses:  Principal Problem:   Respiratory tract infection Active Problems:   Transaminitis   Uncontrolled type 2 diabetes mellitus with  hyperglycemia, without long-term current use of insulin (HCC)   Generalized weakness   OSA on CPAP   Depression  Suspected respiratory tract infection with Nasal congestion/malaise Mild leukocytosis:   CT chest was normal.   Patient was given ceftriaxone and was admitted for  further work-up further revealed no bacterial etiology respiratory virus panel was negative.  Likely reactive airway disease given his steroids bronchodilators and improved.  At this time she feels much improved and feels ready for home and is being discharged home.  Advised to follow-up with PCP may need potentially to get referral for pulmonary  as OP.  Procalcitonin negative at this time patient is clinically improved.  Continue with antihistaminic,steroid Recent Labs  Lab 12/11/21 1636 12/11/21 1950 12/11/21 2149 12/12/21 0429 12/13/21 0532  WBC 11.5*  --  12.6* 11.4* 9.8  LATICACIDVEN 2.5* 1.3  --   --   --   PROCALCITON <0.10  --   --  <0.10 <0.10  Mild lactic acidosis: resolved. Hypokalemia: Repleted  Transaminitis/Recent cholecystectomy: Negative acute viral hepatitis panel -trending down advised to follow-up with PCP to recheck.    Diabetes mellitus A1c 6.7.  Blood sugar borderline controlled in the setting of steroid.  Discharging on quick steroid taper  Recent Labs  Lab 12/11/21 2149 12/11/21 2202 12/12/21 0811 12/12/21 1144 12/12/21 1701 12/12/21 2100 12/13/21 0810  GLUCAP  --    < > 167* 125* 191* 281* 136*  HGBA1C 6.7*  --   --   --   --   --   --    < > =  values in this interval not displayed.    OSA on CPAP Depression mood stable on home meds Class I obesity BMI 32 will benefit with weight loss PC follow-up  Consults: none Subjective: Alert awake resting comfortably no new complaints cough much improved nonproductive.  Ambulating well.  Discharge Exam: Vitals:   12/13/21 0447 12/13/21 0809  BP: 118/69 105/82  Pulse: (!) 51 77  Resp: 20 17  Temp: (!) 97.5 F (36.4 C) 97.8 F  (36.6 C)  SpO2: 100% 99%   General: Pt is alert, awake, not in acute distress Cardiovascular: RRR, S1/S2 +, no rubs, no gallops Respiratory: CTA bilaterally, no wheezing, no rhonchi Abdominal: Soft, NT, ND, bowel sounds + Extremities: no edema, no cyanosis  Discharge Instructions  Discharge Instructions     Discharge instructions   Complete by: As directed    Please follow-up with your primary care doctor and if has persistent system will need referral to allergy specialist or pulmonary physician.  Please call call MD or return to ER for similar or worsening recurring problem that brought you to hospital or if any fever,nausea/vomiting,abdominal pain, uncontrolled pain, chest pain,  shortness of breath or any other alarming symptoms.  Please follow-up your doctor as instructed in a week time and call the office for appointment.  Please avoid alcohol, smoking, or any other illicit substance and maintain healthy habits including taking your regular medications as prescribed.  You were cared for by a hospitalist during your hospital stay. If you have any questions about your discharge medications or the care you received while you were in the hospital after you are discharged, you can call the unit and ask to speak with the hospitalist on call if the hospitalist that took care of you is not available.  Once you are discharged, your primary care physician will handle any further medical issues. Please note that NO REFILLS for any discharge medications will be authorized once you are discharged, as it is imperative that you return to your primary care physician (or establish a relationship with a primary care physician if you do not have one) for your aftercare needs so that they can reassess your need for medications and monitor your lab values   Increase activity slowly   Complete by: As directed       Allergies as of 12/13/2021       Reactions   Codeine Itching, Rash         Medication List     STOP taking these medications    azithromycin 250 MG tablet Commonly known as: ZITHROMAX   cefdinir 300 MG capsule Commonly known as: OMNICEF   methylPREDNISolone 4 MG Tbpk tablet Commonly known as: MEDROL DOSEPAK   ondansetron 4 MG tablet Commonly known as: Zofran       TAKE these medications    acetaminophen 500 MG tablet Commonly known as: TYLENOL Take 1 tablet (500 mg total) by mouth every 6 (six) hours as needed for mild pain, fever or headache.   albuterol 108 (90 Base) MCG/ACT inhaler Commonly known as: VENTOLIN HFA SMARTSIG:2 Puff(s) Via Inhaler 4 Times Daily PRN   amphetamine-dextroamphetamine 20 MG 24 hr capsule Commonly known as: ADDERALL XR Take 20 mg by mouth daily.   aspirin EC 81 MG tablet Take 81 mg by mouth daily.   Austedo 12 MG Tabs Generic drug: Deutetrabenazine Take 1 tablet by mouth 2 (two) times daily.   benzonatate 200 MG capsule Commonly known as: TESSALON Take 1 capsule (200 mg  total) by mouth 3 (three) times daily as needed for up to 20 days.   benztropine 1 MG tablet Commonly known as: COGENTIN Take 1 mg by mouth 2 (two) times daily.   loratadine 10 MG tablet Commonly known as: CLARITIN Take 1 tablet (10 mg total) by mouth daily for 10 days. Start taking on: December 14, 2021   meclizine 25 MG tablet Commonly known as: ANTIVERT Take 25 mg by mouth 3 (three) times daily as needed.   metFORMIN 500 MG 24 hr tablet Commonly known as: GLUCOPHAGE-XR Take 1,000 mg by mouth 2 (two) times daily.   multivitamin with minerals tablet Take 1 tablet by mouth daily. Woman   omeprazole 20 MG capsule Commonly known as: PRILOSEC Take 20 mg by mouth at bedtime.   Ozempic (0.25 or 0.5 MG/DOSE) 2 MG/3ML Sopn Generic drug: Semaglutide(0.25 or 0.5MG /DOS) SMARTSIG:0.375 Milliliter(s) SUB-Q Once a Week   pramipexole 0.5 MG tablet Commonly known as: MIRAPEX Take 0.5 mg by mouth at bedtime.   predniSONE 10 MG  tablet Commonly known as: DELTASONE Take 2 tablets (20 mg total) by mouth daily for 5 days.   promethazine-dextromethorphan 6.25-15 MG/5ML syrup Commonly known as: PROMETHAZINE-DM Take 5 mLs by mouth at bedtime.   rosuvastatin 40 MG tablet Commonly known as: CRESTOR Take 40 mg by mouth at bedtime.   Trintellix 20 MG Tabs tablet Generic drug: vortioxetine HBr Take 20 mg by mouth daily.        Follow-up Information     Allyne Gee, MD Follow up in 1 week(s).   Specialty: Internal Medicine Why: Advised outpatient follow-up with pulmonary or allergy specialist Contact information: [address] [place] [address] [phone]                Allergies  Allergen Reactions   Codeine Itching and Rash    The results of significant diagnostics from this hospitalization (including imaging, microbiology, ancillary and laboratory) are listed below for reference.    Microbiology: Recent Results (from the past 240 hour(s))  Resp Panel by RT-PCR (Flu A&B, Covid) Anterior Nasal Swab     Status: None   Collection Time: 12/11/21  4:36 PM   Specimen: Anterior Nasal Swab  Result Value Ref Range Status   SARS Coronavirus 2 by RT PCR NEGATIVE NEGATIVE Final    Comment: (NOTE) SARS-CoV-2 target nucleic acids are NOT DETECTED.  The SARS-CoV-2 RNA is generally detectable in upper respiratory specimens during the acute phase of infection. The lowest concentration of SARS-CoV-2 viral copies this assay can detect is 138 copies/mL. A negative result does not preclude SARS-Cov-2 infection and should not be used as the sole basis for treatment or other patient management decisions. A negative result may occur with  improper specimen collection/handling, submission of specimen other than nasopharyngeal swab, presence of viral mutation(s) within the areas targeted by this assay, and inadequate number of viral copies(<138 copies/mL). A negative result must be combined  with clinical observations, patient history, and epidemiological information. The expected result is Negative.  Fact Sheet for Patients:  BloggerCourse.com  Fact Sheet for Healthcare Providers:  SeriousBroker.it  This test is no t yet approved or cleared by the [place] FDA and  has been authorized for detection and/or diagnosis of SARS-CoV-2 by FDA under an Emergency Use Authorization (EUA). This EUA will remain  in effect (meaning this test can be used) for the duration of the COVID-19 declaration under Section 564(b)(1) of the Act, 21 U.S.C.section 360bbb-3(b)(1), unless the authorization is  terminated  or revoked sooner.       Influenza A by PCR NEGATIVE NEGATIVE Final   Influenza B by PCR NEGATIVE NEGATIVE Final    Comment: (NOTE) The Xpert Xpress SARS-CoV-2/FLU/RSV plus assay is intended as an aid in the diagnosis of influenza from Nasopharyngeal swab specimens and should not be used as a sole basis for treatment. Nasal washings and aspirates are unacceptable for Xpert Xpress SARS-CoV-2/FLU/RSV testing.  Fact Sheet for Patients: BloggerCourse.com  Fact Sheet for Healthcare Providers: SeriousBroker.it  This test is not yet approved or cleared by the [place] FDA and has been authorized for detection and/or diagnosis of SARS-CoV-2 by FDA under an Emergency Use Authorization (EUA). This EUA will remain in effect (meaning this test can be used) for the duration of the COVID-19 declaration under Section 564(b)(1) of the Act, 21 U.S.C. section 360bbb-3(b)(1), unless the authorization is terminated or revoked.  Performed at [facility], [address] [place], [place] [address]   Blood culture (routine x 2)     Status: None (Preliminary result)   Collection Time: 12/11/21  7:50 PM   Specimen: BLOOD  Result Value Ref Range Status   Specimen  Description BLOOD LEFT ANTECUBITAL  Final   Special Requests   Final    BOTTLES DRAWN AEROBIC AND ANAEROBIC Blood Culture results may not be optimal due to an excessive volume of blood received in culture bottles   Culture   Final    NO GROWTH 2 DAYS Performed at [facility], [address] [place], [place] [address]    Report Status PENDING  Incomplete  Blood culture (routine x 2)     Status: None (Preliminary result)   Collection Time: 12/11/21  7:50 PM   Specimen: BLOOD  Result Value Ref Range Status   Specimen Description BLOOD BLOOD LEFT FOREARM  Final   Special Requests   Final    BOTTLES DRAWN AEROBIC AND ANAEROBIC Blood Culture adequate volume   Culture   Final    NO GROWTH 2 DAYS Performed at [facility], [address], [place], [place] [address]    Report Status PENDING  Incomplete  Respiratory (~20 pathogens) panel by PCR     Status: None   Collection Time: 12/11/21  9:49 PM   Specimen: Nasopharyngeal Swab; Respiratory  Result Value Ref Range Status   Adenovirus NOT DETECTED NOT DETECTED Final   Coronavirus 229E NOT DETECTED NOT DETECTED Final    Comment: (NOTE) The Coronavirus on the Respiratory Panel, DOES NOT test for the novel  Coronavirus (2019 nCoV)    Coronavirus HKU1 NOT DETECTED NOT DETECTED Final   Coronavirus NL63 NOT DETECTED NOT DETECTED Final   Coronavirus OC43 NOT DETECTED NOT DETECTED Final   Metapneumovirus NOT DETECTED NOT DETECTED Final   Rhinovirus / Enterovirus NOT DETECTED NOT DETECTED Final   Influenza A NOT DETECTED NOT DETECTED Final   Influenza B NOT DETECTED NOT DETECTED Final   Parainfluenza Virus 1 NOT DETECTED NOT DETECTED Final   Parainfluenza Virus 2 NOT DETECTED NOT DETECTED Final   Parainfluenza Virus 3 NOT DETECTED NOT DETECTED Final   Parainfluenza Virus 4 NOT DETECTED NOT DETECTED Final   Respiratory Syncytial Virus NOT DETECTED NOT DETECTED Final   Bordetella pertussis NOT DETECTED NOT DETECTED Final    Bordetella Parapertussis NOT DETECTED NOT DETECTED Final   Chlamydophila pneumoniae NOT DETECTED NOT DETECTED Final   Mycoplasma pneumoniae NOT DETECTED NOT DETECTED Final    Comment: Performed at [facility], [place] [address], [place], [place] [address]    Procedures/Studies: CT Chest W Contrast  Result Date: 12/11/2021 CLINICAL DATA:  Cough, chest congestion EXAM: CT CHEST WITH CONTRAST TECHNIQUE: Multidetector CT imaging of the chest was performed during intravenous contrast administration. RADIATION DOSE REDUCTION: This exam was performed according to the departmental dose-optimization program which includes automated exposure control, adjustment of the mA and/or kV according to patient size and/or use of iterative reconstruction technique. CONTRAST:  11mL OMNIPAQUE IOHEXOL 300 MG/ML  SOLN COMPARISON:  Chest radiograph dated 12/11/2021 FINDINGS: Cardiovascular: The heart is normal in size. No pericardial effusion. No evidence of thoracic aortic aneurysm. Mild atherosclerotic calcifications of the arch. Mild three-vessel coronary atherosclerosis. Mediastinum/Nodes: No suspicious mediastinal lymphadenopathy. Visualized thyroid is unremarkable. Lungs/Pleura: Lungs are clear. No focal consolidation. No suspicious pulmonary nodules. No pleural effusion or pneumothorax. Upper Abdomen: Visualized upper abdomen is notable for prior cholecystectomy and vascular calcifications. Musculoskeletal: Degenerative changes of the visualized thoracolumbar spine. IMPRESSION: Normal CT chest. Aortic Atherosclerosis (ICD10-I70.0). Electronically Signed   By: Julian Hy M.D.   On: 12/11/2021 18:32   DG Chest 2 View  Result Date: 12/11/2021 CLINICAL DATA:  cough, congestion EXAM: CHEST - 2 VIEW COMPARISON:  CT chest abdomen/pelvis 02/07/2021. FINDINGS: The heart size and mediastinal contours are within normal limits. Both lungs are clear. No visible pleural effusions or pneumothorax. No acute osseous  abnormality. Right upper quadrant clips. Polyarticular degenerative change. Right humeral head suture anchors. IMPRESSION: No active cardiopulmonary disease. Electronically Signed   By: Margaretha Sheffield M.D.   On: 12/11/2021 16:50    Labs: BNP (last 3 results) No results for input(s): "BNP" in the last 8760 hours. Basic Metabolic Panel: Recent Labs  Lab 12/11/21 1636 12/11/21 2149 12/12/21 0429 12/13/21 0532  NA 137  --  140 137  K 3.5  --  3.4* 4.0  CL 103  --  108 105  CO2 23  --  26 24  GLUCOSE 201*  --  152* 163*  BUN 31*  --  24* 23  CREATININE 1.08* 0.89 0.81 0.84  CALCIUM 9.2  --  8.6* 8.8*   Liver Function Tests: Recent Labs  Lab 12/11/21 1636 12/12/21 0429  AST 57* 39  ALT 104* 85*  ALKPHOS 84 72  BILITOT 0.5 0.4  PROT 7.2 6.5  ALBUMIN 3.7 3.2*   No results for input(s): "LIPASE", "AMYLASE" in the last 168 hours. No results for input(s): "AMMONIA" in the last 168 hours. CBC: Recent Labs  Lab 12/11/21 1636 12/11/21 2149 12/12/21 0429 12/13/21 0532  WBC 11.5* 12.6* 11.4* 9.8  NEUTROABS 9.4*  --   --   --   HGB 13.5 12.9 12.7 12.2  HCT 43.4 41.5 39.3 37.3  MCV 85.4 85.7 84.7 81.8  PLT 296 261 228 219   Cardiac Enzymes: No results for input(s): "CKTOTAL", "CKMB", "CKMBINDEX", "TROPONINI" in the last 168 hours. BNP: Invalid input(s): "POCBNP" CBG: Recent Labs  Lab 12/12/21 0811 12/12/21 1144 12/12/21 1701 12/12/21 2100 12/13/21 0810  GLUCAP 167* 125* 191* 281* 136*   D-Dimer No results for input(s): "DDIMER" in the last 72 hours. Hgb A1c Recent Labs    12/11/21 2149  HGBA1C 6.7*   Lipid Profile No results for input(s): "CHOL", "HDL", "LDLCALC", "TRIG", "CHOLHDL", "LDLDIRECT" in the last 72 hours. Thyroid function studies No results for input(s): "TSH", "T4TOTAL", "T3FREE", "THYROIDAB" in the last 72 hours.  Invalid input(s): "FREET3" Anemia work up No results for input(s): "VITAMINB12", "FOLATE", "FERRITIN", "TIBC", "  IRON",  "RETICCTPCT" in the last 72 hours. Urinalysis    Component Value Date/Time   COLORURINE YELLOW (A) 12/11/2021 1951   APPEARANCEUR HAZY (A) 12/11/2021 1951   LABSPEC 1.028 12/11/2021 1951   PHURINE 6.0 12/11/2021 1951   GLUCOSEU NEGATIVE 12/11/2021 1951   HGBUR NEGATIVE 12/11/2021 1951   BILIRUBINUR NEGATIVE 12/11/2021 1951   KETONESUR NEGATIVE 12/11/2021 1951   PROTEINUR NEGATIVE 12/11/2021 1951   NITRITE NEGATIVE 12/11/2021 1951   LEUKOCYTESUR NEGATIVE 12/11/2021 1951   Sepsis Labs Recent Labs  Lab 12/11/21 1636 12/11/21 2149 12/12/21 0429 12/13/21 0532  WBC 11.5* 12.6* 11.4* 9.8   Microbiology Recent Results (from the past 240 hour(s))  Resp Panel by RT-PCR (Flu A&B, Covid) Anterior Nasal Swab     Status: None   Collection Time: 12/11/21  4:36 PM   Specimen: Anterior Nasal Swab  Result Value Ref Range Status   SARS Coronavirus 2 by RT PCR NEGATIVE NEGATIVE Final    Comment: (NOTE) SARS-CoV-2 target nucleic acids are NOT DETECTED.  The SARS-CoV-2 RNA is generally detectable in upper respiratory specimens during the acute phase of infection. The lowest concentration of SARS-CoV-2 viral copies this assay can detect is 138 copies/mL. A negative result does not preclude SARS-Cov-2 infection and should not be used as the sole basis for treatment or other patient management decisions. A negative result may occur with  improper specimen collection/handling, submission of specimen other than nasopharyngeal swab, presence of viral mutation(s) within the areas targeted by this assay, and inadequate number of viral copies(<138 copies/mL). A negative result must be combined with clinical observations, patient history, and epidemiological information. The expected result is Negative.  Fact Sheet for Patients:  BloggerCourse.com  Fact Sheet for Healthcare Providers:  SeriousBroker.it  This test is no t yet approved or cleared  by the [place] FDA and  has been authorized for detection and/or diagnosis of SARS-CoV-2 by FDA under an Emergency Use Authorization (EUA). This EUA will remain  in effect (meaning this test can be used) for the duration of the COVID-19 declaration under Section 564(b)(1) of the Act, 21 U.S.C.section 360bbb-3(b)(1), unless the authorization is terminated  or revoked sooner.       Influenza A by PCR NEGATIVE NEGATIVE Final   Influenza B by PCR NEGATIVE NEGATIVE Final    Comment: (NOTE) The Xpert Xpress SARS-CoV-2/FLU/RSV plus assay is intended as an aid in the diagnosis of influenza from Nasopharyngeal swab specimens and should not be used as a sole basis for treatment. Nasal washings and aspirates are unacceptable for Xpert Xpress SARS-CoV-2/FLU/RSV testing.  Fact Sheet for Patients: BloggerCourse.com  Fact Sheet for Healthcare Providers: SeriousBroker.it  This test is not yet approved or cleared by the [place] FDA and has been authorized for detection and/or diagnosis of SARS-CoV-2 by FDA under an Emergency Use Authorization (EUA). This EUA will remain in effect (meaning this test can be used) for the duration of the COVID-19 declaration under Section 564(b)(1) of the Act, 21 U.S.C. section 360bbb-3(b)(1), unless the authorization is terminated or revoked.  Performed at [facility], [address] [place], [place] [address]   Blood culture (routine x 2)     Status: None (Preliminary result)   Collection Time: 12/11/21  7:50 PM   Specimen: BLOOD  Result Value Ref Range Status   Specimen Description BLOOD LEFT ANTECUBITAL  Final   Special Requests   Final    BOTTLES DRAWN AEROBIC AND ANAEROBIC Blood Culture results may not be optimal due  to an excessive volume of blood received in culture bottles   Culture   Final    NO GROWTH 2 DAYS Performed at [facility], [address]  [place], [place] [address]    Report Status PENDING  Incomplete  Blood culture (routine x 2)     Status: None (Preliminary result)   Collection Time: 12/11/21  7:50 PM   Specimen: BLOOD  Result Value Ref Range Status   Specimen Description BLOOD BLOOD LEFT FOREARM  Final   Special Requests   Final    BOTTLES DRAWN AEROBIC AND ANAEROBIC Blood Culture adequate volume   Culture   Final    NO GROWTH 2 DAYS Performed at [facility], [address] [place], [place] [address]    Report Status PENDING  Incomplete  Respiratory (~20 pathogens) panel by PCR     Status: None   Collection Time: 12/11/21  9:49 PM   Specimen: Nasopharyngeal Swab; Respiratory  Result Value Ref Range Status   Adenovirus NOT DETECTED NOT DETECTED Final   Coronavirus 229E NOT DETECTED NOT DETECTED Final    Comment: (NOTE) The Coronavirus on the Respiratory Panel, DOES NOT test for the novel  Coronavirus (2019 nCoV)    Coronavirus HKU1 NOT DETECTED NOT DETECTED Final   Coronavirus NL63 NOT DETECTED NOT DETECTED Final   Coronavirus OC43 NOT DETECTED NOT DETECTED Final   Metapneumovirus NOT DETECTED NOT DETECTED Final   Rhinovirus / Enterovirus NOT DETECTED NOT DETECTED Final   Influenza A NOT DETECTED NOT DETECTED Final   Influenza B NOT DETECTED NOT DETECTED Final   Parainfluenza Virus 1 NOT DETECTED NOT DETECTED Final   Parainfluenza Virus 2 NOT DETECTED NOT DETECTED Final   Parainfluenza Virus 3 NOT DETECTED NOT DETECTED Final   Parainfluenza Virus 4 NOT DETECTED NOT DETECTED Final   Respiratory Syncytial Virus NOT DETECTED NOT DETECTED Final   Bordetella pertussis NOT DETECTED NOT DETECTED Final   Bordetella Parapertussis NOT DETECTED NOT DETECTED Final   Chlamydophila pneumoniae NOT DETECTED NOT DETECTED Final   Mycoplasma pneumoniae NOT DETECTED NOT DETECTED Final    Comment: Performed at [facility], [address] [place], [place] [address]  Time coordinating discharge: 25  minutes  SIGNED: Lanae Boastamesh Chelsey Redondo, MD  [facility] 12/13/2021, 12:32 PM  If 7PM-7AM, please contact night-coverage [email or web address]

## 2021-12-16 LAB — CULTURE, BLOOD (ROUTINE X 2)
Culture: NO GROWTH
Culture: NO GROWTH
Special Requests: ADEQUATE

## 2022-02-10 ENCOUNTER — Other Ambulatory Visit: Payer: Self-pay | Admitting: Internal Medicine

## 2022-02-10 DIAGNOSIS — R1032 Left lower quadrant pain: Secondary | ICD-10-CM

## 2022-02-10 DIAGNOSIS — R112 Nausea with vomiting, unspecified: Secondary | ICD-10-CM

## 2022-02-17 ENCOUNTER — Ambulatory Visit
Admission: RE | Admit: 2022-02-17 | Discharge: 2022-02-17 | Disposition: A | Discharge status: Home / Self Care | Payer: Medicare Other | Source: Ambulatory Visit | Attending: Internal Medicine | Admitting: Internal Medicine

## 2022-02-17 DIAGNOSIS — R1032 Left lower quadrant pain: Secondary | ICD-10-CM | POA: Diagnosis present

## 2022-02-17 DIAGNOSIS — R112 Nausea with vomiting, unspecified: Secondary | ICD-10-CM | POA: Insufficient documentation

## 2022-02-17 MED ORDER — IOHEXOL 300 MG/ML  SOLN
100.0000 mL | Freq: Once | INTRAMUSCULAR | Status: AC | PRN
  Administered 2022-02-17: 100 mL via INTRAVENOUS

## 2023-05-06 IMAGING — MR MR MRCP
8 of 11 series · 33 of 48 positions shown · non-contrast
Comparison: Ultrasound on 03/23/2021, and CT on 03/18/2021

CLINICAL DATA: Right upper quadrant pain. Cholelithiasis. Mild
dilatation of common bile duct on recent ultrasound.

EXAM:
MRI ABDOMEN WITHOUT CONTRAST  (INCLUDING MRCP)
TECHNIQUE: Multiplanar multisequence MR imaging of the abdomen was performed.
Heavily T2-weighted images of the biliary and pancreatic ducts were
obtained, and three-dimensional MRCP images were rendered by post
processing.

[Series 3: T2 fat-sat · axial · 6.0mm · 1.25mm/px · z∈[-133,+147]mm · 2 of 40 slices shown]
[im 1/40]
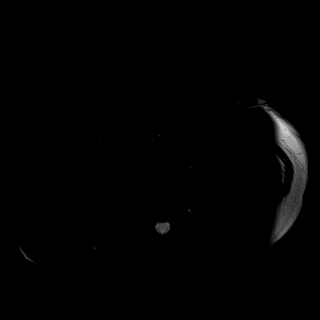
[im 40/40]
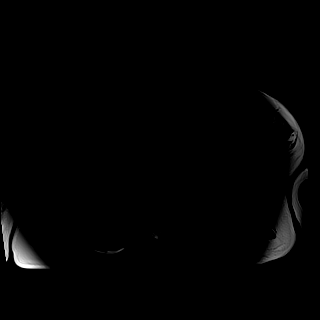

[Series 6: T2 · coronal · 6.0mm · 1.48mm/px · 3 of 35 slices shown]
[im 1/35]
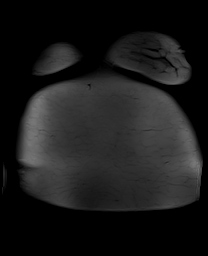
[im 18/35]
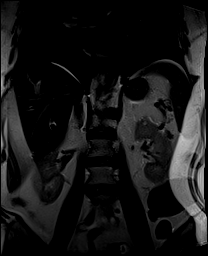
[im 35/35]
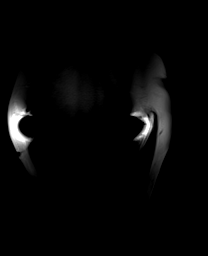

[Series 7: DWI · axial · 6.0mm · 1.49mm/px · z∈[-131,+149]mm · 6 of 80 slices shown (1 of 2)]
[im 1/80]
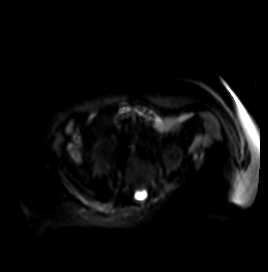
[im 16/80]
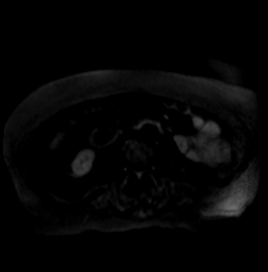
[im 32/80]
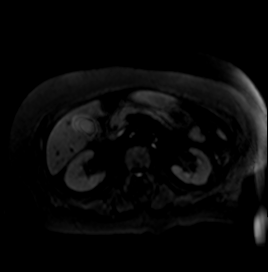
[im 48/80]
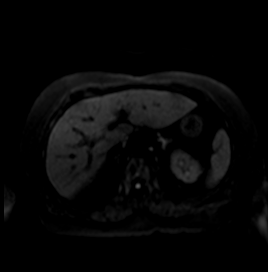
[im 64/80]
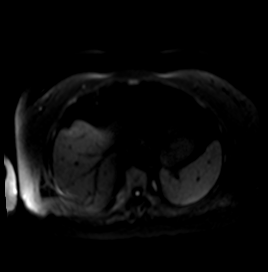
[im 80/80]
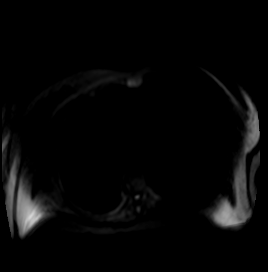

[Series 8: DWI · axial · 6.0mm · 1.49mm/px · z∈[-131,+149]mm · 3 of 40 slices shown (2 of 2)]
[im 1/40]
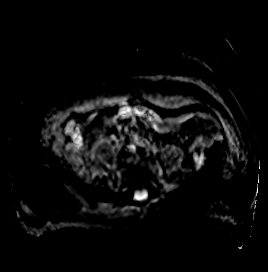
[im 20/40]
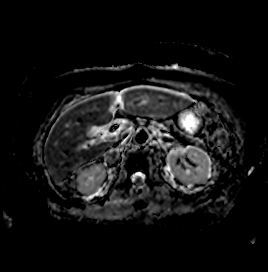
[im 40/40]
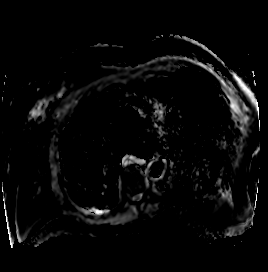

[Series 9: T1 · axial · 3.0mm · 1.25mm/px · z∈[-120,+141]mm · 7 of 88 slices shown (1 of 2)]
[im 1/88]
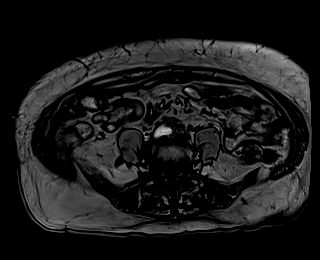
[im 15/88]
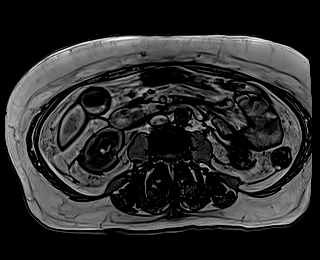
[im 30/88]
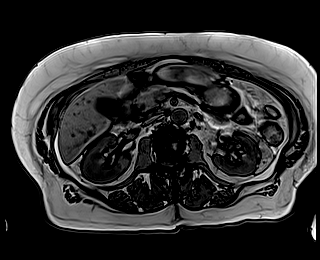
[im 44/88]
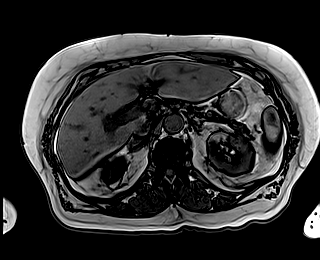
[im 59/88]
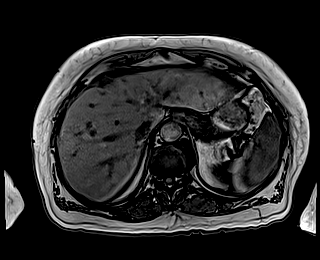
[im 73/88]
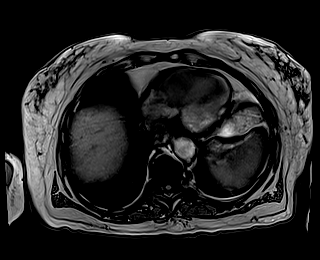
[im 88/88]
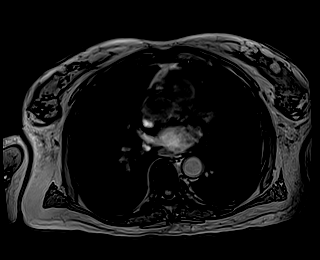

[Series 10: T1 · axial · 3.0mm · 1.25mm/px · z∈[-120,+141]mm · 7 of 88 slices shown (2 of 2)]
[im 1/88]
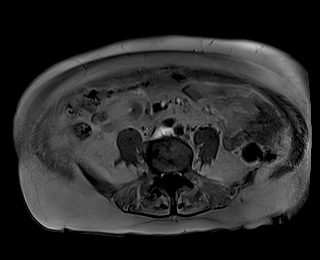
[im 15/88]
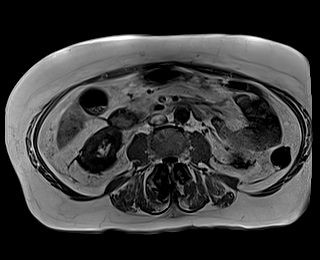
[im 30/88]
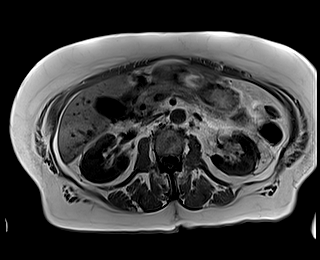
[im 44/88]
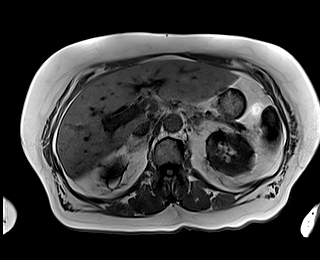
[im 59/88]
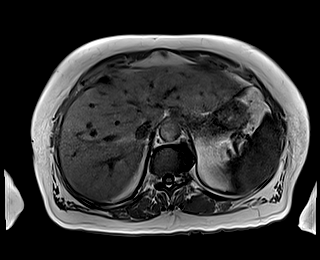
[im 73/88]
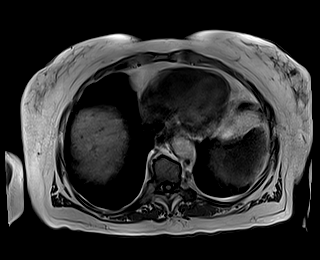
[im 88/88]
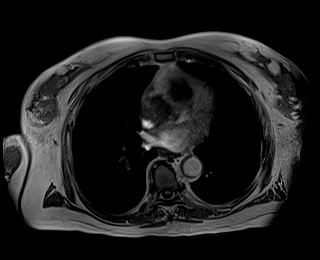

[Series 13: cor obl thk · sagittal · 50.0mm · 0.78mm/px · 1 of 9 slices shown]
[im 1/9]
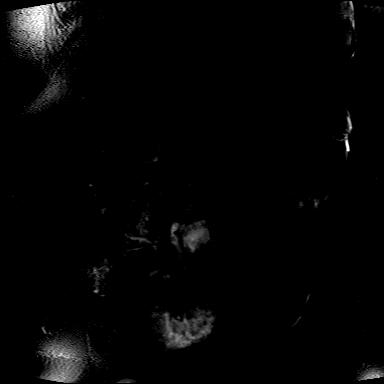

[Series 14: cor_3d_spc_trig · coronal · 1.0mm · 0.49mm/px · 4 of 72 slices shown]
[im 1/72]
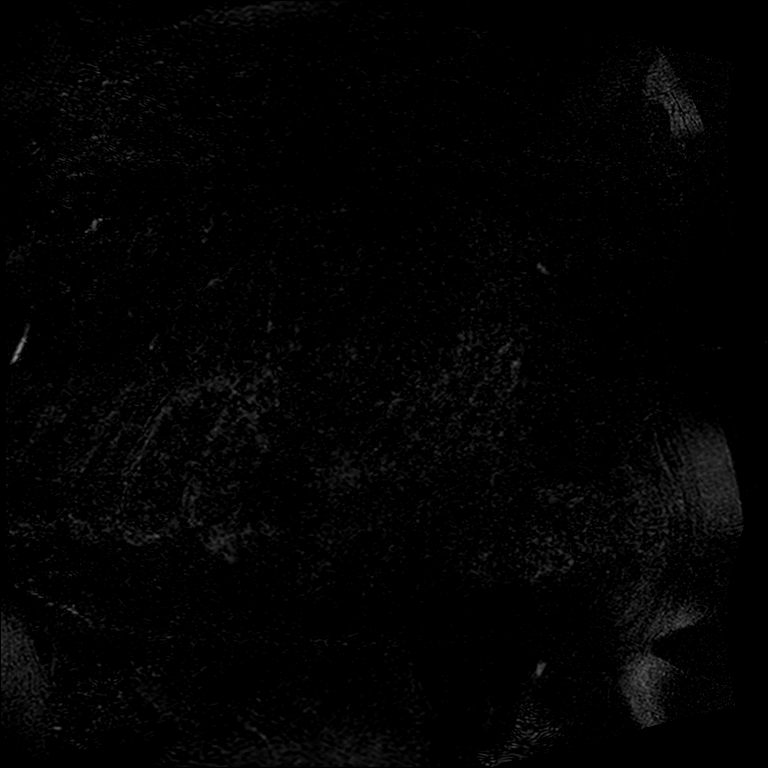
[im 15/72]
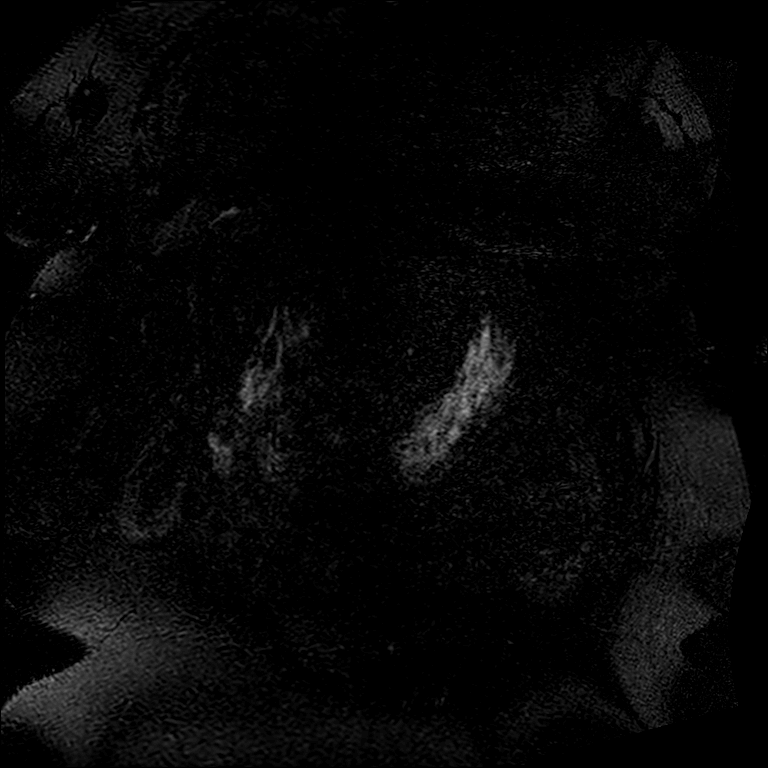
[im 29/72]
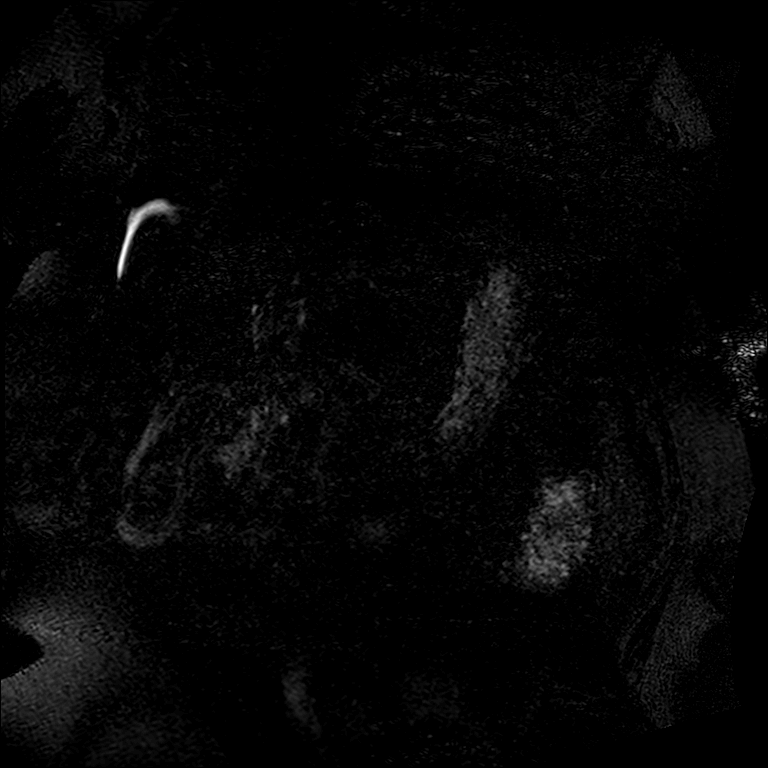
[im 43/72]
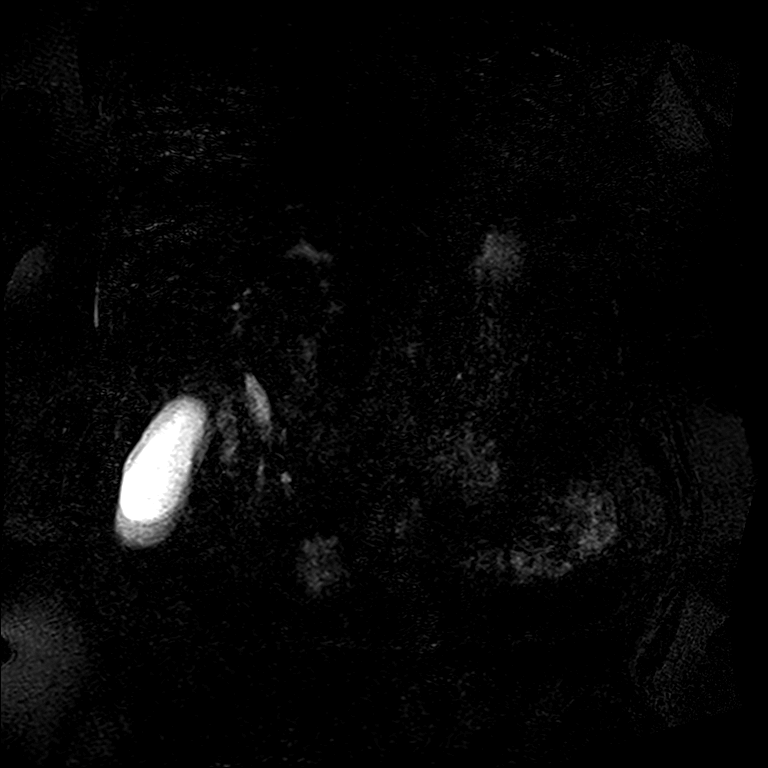

[33 of 48 positions shown; findings below may reference images not displayed]

FINDINGS: Lower chest: Tiny right pleural effusion.

Hepatobiliary: No masses visualized on this unenhanced exam.
Multiple sub-cm gallstones are again seen. Mild diffuse gallbladder
wall thickening is seen, however there is no evidence of
pericholecystic inflammatory changes or fluid.

The common bile duct measures 6 mm in diameter which is at the upper
limits of normal. A 5 mm calculus is seen within the distal common
bile duct.

Pancreas: No mass or inflammatory process visualized on this
unenhanced exam. Pancreas divisum is noted, however there is no
evidence of pancreatic ductal dilatation.

Spleen:  Within normal limits in size.

Adrenals/Urinary tract: Normal adrenal glands. 1 cm cyst noted in
midpole of left kidney. No evidence of hydronephrosis.

Stomach/Bowel: Unremarkable.

Vascular/Lymphatic: No pathologically enlarged lymph nodes
identified. No evidence of abdominal aortic aneurysm.

Other:  None.

Musculoskeletal:  No suspicious bone lesions identified.
IMPRESSION: Cholelithiasis. Mild diffuse gallbladder wall thickening, without
pericholecystic inflammatory changes or fluid. Cholecystitis cannot
be excluded. Consider nuclear medicine hepatobiliary scan for
further evaluation if clinically warranted.

5 mm calculus in the distal common bile duct. Borderline dilatation
of common bile duct measuring 6 mm.

Pancreas divisum incidentally noted. No evidence of pancreatic
ductal dilatation.

## 2024-01-14 ENCOUNTER — Other Ambulatory Visit: Payer: Self-pay | Admitting: Orthopedic Surgery

## 2024-01-14 DIAGNOSIS — M89561 Osteolysis, right lower leg: Secondary | ICD-10-CM

## 2024-01-14 DIAGNOSIS — T84038A Mechanical loosening of other internal prosthetic joint, initial encounter: Secondary | ICD-10-CM

## 2024-01-14 DIAGNOSIS — Z96651 Presence of right artificial knee joint: Secondary | ICD-10-CM

## 2024-01-17 ENCOUNTER — Encounter
Admission: RE | Admit: 2024-01-17 | Discharge: 2024-01-17 | Disposition: A | Discharge status: Home / Self Care | Source: Ambulatory Visit | Attending: Orthopedic Surgery | Admitting: Orthopedic Surgery

## 2024-01-17 DIAGNOSIS — M89561 Osteolysis, right lower leg: Secondary | ICD-10-CM | POA: Insufficient documentation

## 2024-01-17 DIAGNOSIS — T84038A Mechanical loosening of other internal prosthetic joint, initial encounter: Secondary | ICD-10-CM | POA: Diagnosis present

## 2024-01-17 DIAGNOSIS — Z96651 Presence of right artificial knee joint: Secondary | ICD-10-CM | POA: Insufficient documentation

## 2024-01-17 DIAGNOSIS — Z96659 Presence of unspecified artificial knee joint: Secondary | ICD-10-CM | POA: Diagnosis present

## 2024-01-17 MED ORDER — TECHNETIUM TC 99M MEDRONATE IV KIT
20.0000 | PACK | Freq: Once | INTRAVENOUS | Status: AC | PRN
  Administered 2024-01-17: 22.66 via INTRAVENOUS

## 2024-01-19 NOTE — H&P (View-Only) (Signed)
 ORTHOPAEDIC SURGERY- CLINIC NOTE  Chief Complaint: Left hand numbness/ tingling  History of Present Illness: History of Present Illness Sabrina Arroyo is a 73 year old female with diabetes who presents with worsening numbness and tingling in the left hand.  She has been experiencing numbness and tingling in her left hand for about a year, with symptoms worsening over time. The symptoms affect all fingers. The numbness and tingling are intermittent and sometimes wake her up at night. She has tried using a wrist brace, which was minimally effective. She has a history of receiving an injection in her elbow, which provided some relief for a few months. She has not had an injection in the wrist. She mentions difficulty with activities such as eating due to the numbness, particularly when her thumb goes numb.  She also reports having a trigger finger in the middle finger of the left hand, which has been present for about a month. She received a shot for this condition approximately six months ago, which helped for a couple of months. The trigger finger causes difficulty in making a full fist and sometimes locks.  She has diabetes with a recent A1c of 6.6 as of October. No lung or heart issues and she is not on blood thinners.  Prior medical records from Dr. Sharrie were reviewed.    PMHx, PSurgHx, Fam Hx, Soc Hx, Meds, Allergies: Past Medical History:  Diagnosis Date  . Abnormal cytology 1979   Complete hysterectomy  . Allergic state 1973?   Codeine  . Arthritis   . Chronic insomnia 07/09/2014   neurontin 300 qhs per neuro  . Controlled type 2 diabetes mellitus with diabetic nephropathy, without long-term current use of insulin  (CMS/HHS-HCC)   . Controlled type 2 diabetes mellitus with diabetic nephropathy, without long-term current use of insulin  (CMS/HHS-HCC)   . Controlled type 2 diabetes mellitus with diabetic neuropathy (CMS/HHS-HCC)   . Depression   . 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 Essential hypertension 08/13/2016  . Family history of colon cancer 07/09/2014   Mother x 2  . GERD (gastroesophageal reflux disease) 2015  . Headache disorder 07/16/2010   Headache, unspecified; Entered By: Johnnie HookerSigned By: Kaylee Hooker    . History of abnormal cervical Pap smear 1979   Complete hysterectomy  . History of cataract 2017   Watching  . Hypertriglyceridemia 07/09/2014   Lopid daily only  . Irritable bowel syndrome with diarrhea 11/15/2014   Ongoing for many years Diagnosed by prior PCP in 2015  . OSA on CPAP 04/14/2016   Cpap 11-12 cm Following now with [facility] Last seen 04/08/16 Tynan PA  [phone]  . Osteoarthritis of both knees 07/09/2014   Ortho, Duexis 800/26.5 tid  . Osteopenia of necks of both femurs 11/28/2017   Your FRAX score was calculated and low. Major risk 7.9% Hip risk 1.1%  . Sleep apnea   . Tardive dyskinesia   . Vitamin D deficiency 08/13/2016   Normal last 5 years      Past Surgical History:  Procedure Laterality Date  . APPENDECTOMY  1979   taken with hysterectomy due to endometriosis  . HYSTERECTOMY  1979   complete, due to endometriosis  . EYELID SURGERY  11/2017  . Trigger Finger Release Right 07/07/2019   Rt Thumb, Dr, Kathlynn  . ARTHROPLASTY TOTAL KNEE Left 12/31/2020   Dr. Kathlynn  . EGD N/A 02/14/2021   Procedure: EGD;  Surgeon: Dinani, Amreen, MD;  Location: [facility];  Service: Gastroenterology;  Laterality: N/A;  . COLONOSCOPY N/A 07/01/2021   Procedure: COLONOSCOPY, FLEXIBLE; DIAGNOSTIC,;  Surgeon: Tobie Kipper, MD;  [facility];  Service: Gastroenterology;  Laterality: N/A;  . COLONOSCOPY W/REMOVAL LESIONS BY SNARE N/A 04/27/2023   Procedure: COLONOSCOPY, FLEXIBLE; WITH REMOVAL OF TUMOR(S), POLYP(S), OR OTHER LESION(S) BY SNARE TECHNIQUE;  Surgeon: Willma Morene Shad, MD;  Location: [facility];   Service: Gastroenterology;  Laterality: N/A;  . BREAST SURGERY    . JOINT REPLACEMENT  [id number]   Right knee  . SHOULDER SURGERY      Family History  Problem Relation Age of Onset  . Breast cancer Mother   . Colon cancer Mother   . Arthritis Mother   . Macular degeneration Mother   . Heart murmur Sister   . Breast cancer Sister   . Lupus Sister   . Heart murmur Brother   . Anesthesia problems Daughter   . Lupus Daughter   . Hernia Daughter   . Lupus Daughter   . Thyroid cancer Sister        Just had removed  . Blindness Neg Hx   . Glaucoma Neg Hx   . Retinal detachment Neg Hx   . Diabetes Neg Hx   . High blood pressure (Hypertension) Neg Hx     Social History   Socioeconomic History  . Marital status: Divorced  . Number of children: 2  . Years of education: 60  . Highest education level: High school graduate  Occupational History  . Occupation: Retired    Comment: science writer  . Occupation: part time    Comment: Sparking driver  Tobacco Use  . Smoking status: Never    Passive exposure: Past  . Smokeless tobacco: Never  Vaping Use  . Vaping status: Never Used  Substance and Sexual Activity  . Alcohol use: Not Currently  . Drug use: Never  . Sexual activity: Not Currently    Partners: Male    Birth control/protection: None    Comment: comp hysterectomy  Social History Narrative   Lives alone. 3 cats.    Medical equipment used in the home: CPAP, BP machine, no grab bars in place   Social Drivers of Health   Financial Resource Strain: Low Risk  (01/11/2024)   Overall Financial Resource Strain (CARDIA)   . Difficulty of Paying Living Expenses: Not hard at all  Food Insecurity: No Food Insecurity (01/11/2024)   Hunger Vital Sign   . Worried About Programme Researcher, Broadcasting/film/video in the Last Year: Never true   . Ran Out of Food in the Last Year: Never true  Transportation Needs: No Transportation Needs (01/11/2024)   PRAPARE - Transportation   . Lack of Transportation  (Medical): No   . Lack of Transportation (Non-Medical): No  Physical Activity: Insufficiently Active (04/15/2017)   Exercise Vital Sign   . Days of Exercise per Week: 2 days   . Minutes of Exercise per Session: 20 min  Stress: Stress Concern Present (04/15/2017)   [employer] of Occupational Health - Occupational Stress Questionnaire   . Feeling of Stress : Very much  Social Connections: Moderately Isolated (04/15/2017)   Social Connection and Isolation Panel   . Frequency of Communication with Friends and Family: More than three times a week   . Frequency of Social Gatherings with Friends and Family: More than three times a week   . Attends Religious Services: Never   . Active Member  of Clubs or Organizations: No   . Attends Banker Meetings: Never   . Marital Status: Divorced  Housing Stability: Low Risk  (01/11/2024)   Housing Stability Vital Sign   . Unable to Pay for Housing in the Last Year: No   . Number of Times Moved in the Last Year: 0   . Homeless in the Last Year: No     Current Outpatient Medications  Medication Sig Dispense Refill  . *multivitamins oral     . alclometasone (ACLOVATE) 0.05 % cream Apply a pea sized amt with econazole. Apply to affected areas twice daily. Stop when smooth. 45 g 6  . blood glucose diagnostic test strip 1 each (1 strip total) 3 (three) times daily Use as instructed. 100 each 12  . blood glucose meter kit as directed 1 each 0  . ciclopirox (PENLAC) 8 % topical nail solution Apply topically at bedtime Apply over nail and surrounding skin. 6.6 mL 3  . deutetrabenazine (AUSTEDO) 12 mg Tab Take 24 mg by mouth 2 (two) times daily ( From psych) 1 tablet 0  . dextroamphetamine -amphetamine  (ADDERALL  XR) 10 MG XR capsule Take 10 mg by mouth every morning    . econazole (SPECTAZOLE) 1 % cream Apply a pea sized amt with alclomethasone twice daily to affected areas. Stop when smooth. 30 g 6  . empagliflozin (JARDIANCE) 25 mg tablet Take  1 tablet (25 mg total) by mouth once daily 100 tablet 3  . glipiZIDE (GLUCOTROL) 10 MG tablet Take 1 tab po daily with meal 100 tablet 3  . INGREZZA 80 mg Cap     . lancets Use 1 each 3 (three) times daily Use as instructed. 100 each 12  . multivitamin with minerals tablet Take 1 tablet by mouth once daily    . omeprazole (PRILOSEC) 20 MG DR capsule Take 1 capsule (20 mg total) by mouth once daily 100 capsule 3  . pramipexole  (MIRAPEX ) 0.5 MG tablet Take 1 mg by mouth every evening    . rosuvastatin  (CRESTOR ) 40 MG tablet Take 1 tablet (40 mg total) by mouth at bedtime (replacing zocor) for cholesterol 100 tablet 3  . TRINTELLIX  20 mg Tab 1 tablet (20 mg total) every evening  0   No current facility-administered medications for this visit.    Allergies  Allergen Reactions  . Semaglutide Abdominal Pain    Severe N/V and abd pain - ? Pancreatitis, defer future use  . Codeine Rash and Itching    Review of Systems: A 10+ ROS was performed, reviewed, and the pertinent orthopaedic findings are documented in the HPI.  I have reviewed and agree with the ROS captured by the CMA.    Physical Exam: BP (!) 142/88   Ht 165.1 cm (5' 5)   Wt 95.3 kg (210 lb)   LMP  (LMP Unknown)   BMI 34.95 kg/m  General/Constitutional: No apparent distress: well-nourished and well developed. Eyes: Pupils equal, round with synchronous movement. Lymphatic: No palpable adenopathy. Respiratory: Patient has good chest rise and fall with inspiration and expiration.  All lung fields are clear to auscultation bilaterally.  There is no Rales, rhonchi or wheezes appreciated. Cardiovascular: Upon auscultation there is a regular rate and rhythm without any murmurs, rubs, gallops or heaves appreciated. Integumentary: No impressive skin lesions present, except as noted in detailed exam. Neuro/Psych: Normal mood and affect, oriented to person, place and time. Musculoskeletal: see exam below  Left Upper  Extremity: Negative Tinel's at the wrist,  positive Phalen's, positive Durkan's.  Positive Tinel's and flexion compression at the elbow.  5 out of 5 thumb palmar abduction strength.  5 out of 5 finger abduction strength.  No obvious atrophy of the thenar eminence or interossei.  Able to make a full composite fist with 5 out of 5 strength of the small finger FDP.  She lacks a small amount of terminal flexion of the middle finger.  Has pain to palpation of the middle finger A1 pulley.  I am unable to elicit active triggering of the middle finger.  Mildly positive Froment's sign.  Negative Wartenberg's.  Sensation intact to light touch to all of the digits.  Digits are warm well-perfused. Imaging and Results: Electrodiagnostic studies obtained on 05/04/2023 were personally reviewed: EMG & NCV Findings: Evaluation of the Left median motor nerve showed prolonged distal onset latency (7.4 ms) and decreased conduction velocity (Elbow-Wrist, 47 m/s).  The Left ulnar motor nerve showed reduced amplitude (3.8 mV), decreased conduction velocity (B Elbow-Wrist, 21 m/s), and decreased conduction velocity (A Elbow-B Elbow, 25 m/s).  The Left median sensory and the Right median sensory nerves showed prolonged distal peak latency (L4.2, R4.2 ms) and decreased conduction velocity (Wrist-2nd Digit, L31, R31 m/s).  The Left ulnar sensory nerve showed reduced amplitude (11.1 V) and decreased conduction velocity (Wrist-5th Digit, 32 m/s).  The Right ulnar sensory nerve showed reduced amplitude (11.6 V).  The Left median/ulnar (palm) comparison nerve showed no response (Median Palm) and prolonged distal peak latency (Ulnar Palm, 4.0 ms).  The Right median/ulnar (palm) comparison nerve showed prolonged distal peak latency (Median Palm, 2.8 ms) and abnormal peak latency difference (Median Palm-Ulnar Palm, 1.2 ms).  All remaining nerves (as indicated in the following tables) were within normal limits.   EMG    Side Muscle Nerve Root  Ins Act Fibs Psw Amp Dur Poly Recrt Int Bruna Comment  Left Abd Poll Brev Median C8-T1 Nml Nml Nml Nml Nml 0 Nml Nml    Left 1stDorInt Ulnar C8-T1 Nml Nml Nml Incr >36ms 1+ Nml Nml    Left PronatorTeres Median C6-7 Nml Nml Nml Nml Nml 0 Nml Nml    Left Biceps Musculocut C5-6 Nml Nml Nml Nml Nml 0 Nml Nml    Left Triceps Radial C6-7-8 Nml Nml Nml Nml Nml 0 Nml Nml    Left FlexCarpiUln Ulnar C8,T1 Nml Nml Nml Incr >51ms 1+ Nml Nml      Impression: Abnormal study. There is electrodiagnostic evidence of chronic, moderate left carpal tunnel syndrome.  Assessment & Plan: Assessment & Plan Left carpal tunnel syndrome She has a positive nerve study as well as failed conservative management - Scheduled left carpal tunnel release surgery. - Educated on wearing wrist brace at night.  Left cubital tunnel syndrome Chronic left cubital tunnel syndrome with positive response to elbow injection. Surgery preferred for efficacy and long-term relief. - Scheduled left cubital tunnel release surgery.  Left middle finger trigger finger Recurrent left middle finger trigger finger with temporary relief from previous injection. Surgery preferred for efficacy and long-term relief. - Scheduled left middle finger trigger finger release surgery.  The risk, benefits, alternatives to the above operative interventions were discussed with the patient.  Risks include infection, injury to nerve and other nearby structures, stiffness, chronic pain, incomplete relief of symptoms, hematoma, need for revision surgery.  She would like to proceed with surgical intervention  Will follow-up postoperatively  Jackquline CANDIE Barrack, MD [facility]opa[facility] and [facility] [address]  [place], [place] [address] Phone: [phone]  This note was generated in part with voice recognition software; please excuse any typographical errors that were not detected and corrected. This note has been created using automated  tools and reviewed for accuracy by [facility].

## 2024-01-26 ENCOUNTER — Other Ambulatory Visit: Payer: Self-pay

## 2024-02-03 ENCOUNTER — Inpatient Hospital Stay: Admission: RE | Admit: 2024-02-03 | Discharge: 2024-02-03 | Disposition: A | Source: Ambulatory Visit

## 2024-02-03 ENCOUNTER — Other Ambulatory Visit: Payer: Self-pay

## 2024-02-03 HISTORY — DX: Carpal tunnel syndrome, left upper limb: G56.02

## 2024-02-03 HISTORY — DX: Lesion of ulnar nerve, left upper limb: G56.22

## 2024-02-03 HISTORY — DX: Trigger finger, left middle finger: M65.332

## 2024-02-03 NOTE — Patient Instructions (Addendum)
 Your procedure is scheduled on: TUESDAY 02/08/24  Report to the Registration Desk on the 1st floor of the [facility].  To find out your arrival time, please call [phone] between 1PM - 3PM on: MONDAY 02/07/24  If your arrival time is 6:00 am, do not arrive before that time as the [facility] entrance doors do not open until 6:00 am.  REMEMBER: Instructions that are not followed completely may result in serious medical risk, up to and including death; or upon the discretion of your surgeon and anesthesiologist your surgery may need to be rescheduled.  Do not eat food after midnight the night before surgery.  No gum chewing or hard candies.  You may however, drink CLEAR liquids up to 2 hours before you are scheduled to arrive for your surgery. Do not drink anything within 2 hours of your scheduled arrival time.  Clear liquids include: - water   - apple juice without pulp - gatorade (not RED colors) - black coffee or tea (Do NOT add milk or creamers to the coffee or tea) Do NOT drink anything that is not on this list.  One week prior to surgery: Stop Anti-inflammatories (NSAIDS) such as Advil, Aleve, Ibuprofen, Motrin, Naproxen, Naprosyn and Aspirin  based products such as Excedrin, Goody's Powder, BC Powder.  Stop ANY OVER THE COUNTER supplements until after surgery.  STOP empagliflozin (JARDIANCE) 3 days before surgery.  Last dose 02/04/24   You may however, continue to take Tylenol  if needed for pain up until the day of surgery.  Continue taking all of your other prescription medications up until the day of surgery.  ON THE DAY OF SURGERY ONLY TAKE THESE MEDICATIONS WITH SIPS OF WATER :  omeprazole (PRILOSEC)   Use inhalers on the day of surgery and bring to the hospital.   No Alcohol for 24 hours before or after surgery.   No Smoking including e-cigarettes for 24 hours before surgery.  No chewable tobacco products for at least 6 hours before surgery.  No nicotine patches  on the day of surgery.  Do not use any recreational drugs for at least a week (preferably 2 weeks) before your surgery.  Please be advised that the combination of cocaine and anesthesia may have negative outcomes, up to and including death. If you test positive for cocaine, your surgery will be cancelled.   On the morning of surgery brush your teeth with toothpaste and water , you may rinse your mouth with mouthwash if you wish. Do not swallow any toothpaste or mouthwash.   Use CHG Soap or wipes as directed on instruction sheet.  Do not wear jewelry, make-up, hairpins, clips or nail polish.  For welded (permanent) jewelry: bracelets, anklets, waist bands, etc.  Please have this removed prior to surgery.  If it is not removed, there is a chance that hospital personnel will need to cut it off on the day of surgery.  Do not wear lotions, powders, or perfumes.   Do not shave body hair from the neck down 48 hours before surgery.  Contact lenses, hearing aids and dentures may not be worn into surgery.  Do not bring valuables to the hospital. [facility] is not responsible for any missing/lost belongings or valuables.   Bring your C-PAP to the hospital in case you may have to spend the night.   Notify your doctor if there is any change in your medical condition (cold, fever, infection).  Wear comfortable clothing (specific to your surgery type) to the hospital.  After  surgery, you can help prevent lung complications by doing breathing exercises.  Take deep breaths and cough every 1-2 hours. Your doctor may order a device called an Incentive Spirometer to help you take deep breaths. When coughing or sneezing, hold a pillow firmly against your incision with both hands. This is called "splinting." Doing this helps protect your incision. It also decreases belly discomfort.  If you are being discharged the day of surgery, you will not be allowed to drive home. You will need a responsible  individual to drive you home and stay with you for 24 hours after surgery.   If you are taking public transportation, you will need to have a responsible individual with you.  Please call the Pre-admissions Testing Dept. at [phone] if you have any questions about these instructions.  Surgery Visitation Policy:  Patients having surgery or a procedure may have two visitors.  Children under the age of 107 must have an adult with them who is not the patient.  Merchandiser, Retail to address health-related social needs:  [email or web address]                                                                                                             Preparing for Surgery with CHLORHEXIDINE  GLUCONATE (CHG) Soap  Chlorhexidine  Gluconate (CHG) Soap  o An antiseptic cleaner that kills germs and bonds with the skin to continue killing germs even after washing  o Used for showering the night before surgery and morning of surgery  Before surgery, you can play an important role by reducing the number of germs on your skin.  CHG (Chlorhexidine  gluconate) soap is an antiseptic cleanser which kills germs and bonds with the skin to continue killing germs even after washing.  Please do not use if you have an allergy to CHG or antibacterial soaps. If your skin becomes reddened/irritated stop using the CHG.  1. Shower the NIGHT BEFORE SURGERY with CHG soap.  2. If you choose to wash your hair, wash your hair first as usual with your normal shampoo.  3. After shampooing, rinse your hair and body thoroughly to remove the shampoo.  4. Use CHG as you would any other liquid soap. You can apply CHG directly to the skin and wash gently with a clean washcloth.  5. Apply the CHG soap to your body only from the neck down. Do not use on open wounds or open sores. Avoid contact with your eyes, ears, mouth, and genitals (private parts). Wash face and genitals (private parts) with your  normal soap.  6. Wash thoroughly, paying special attention to the area where your surgery will be performed.  7. Thoroughly rinse your body with warm water .  8. Do not shower/wash with your normal soap after using and rinsing off the CHG soap.  9. Do not use lotions, oils, etc., after showering with CHG.  10. Pat yourself dry with a clean towel.  11. Wear clean pajamas to bed the night before surgery.  12. Place clean sheets on your bed the  night of your shower and do not sleep with pets.  13. Do not apply any deodorants/lotions/powders.  14. Please wear clean clothes to the hospital.  15. Remember to brush your teeth with your regular toothpaste.

## 2024-02-04 ENCOUNTER — Inpatient Hospital Stay: Admission: RE | Admit: 2024-02-04 | Discharge: 2024-02-04

## 2024-02-04 DIAGNOSIS — E119 Type 2 diabetes mellitus without complications: Secondary | ICD-10-CM

## 2024-02-04 DIAGNOSIS — Z01810 Encounter for preprocedural cardiovascular examination: Secondary | ICD-10-CM

## 2024-02-08 ENCOUNTER — Encounter: Admission: RE | Disposition: A | Payer: Self-pay | Source: Home / Self Care

## 2024-02-08 ENCOUNTER — Ambulatory Visit: Admission: RE | Admit: 2024-02-08 | Discharge: 2024-02-08 | Disposition: A

## 2024-02-08 ENCOUNTER — Other Ambulatory Visit: Payer: Self-pay

## 2024-02-08 ENCOUNTER — Ambulatory Visit: Admitting: Anesthesiology

## 2024-02-08 ENCOUNTER — Ambulatory Visit

## 2024-02-08 HISTORY — PX: TRIGGER FINGER RELEASE: SHX641

## 2024-02-08 HISTORY — PX: ULNAR TUNNEL RELEASE: SHX820

## 2024-02-08 HISTORY — PX: CARPAL TUNNEL RELEASE: SHX101

## 2024-02-08 LAB — GLUCOSE, CAPILLARY
Glucose-Capillary: 129 mg/dL — ABNORMAL HIGH (ref 70–99)
Glucose-Capillary: 101 mg/dL — ABNORMAL HIGH (ref 70–99)

## 2024-02-08 SURGERY — CARPAL TUNNEL RELEASE
Anesthesia: General | Site: Wrist | Laterality: Left

## 2024-02-08 MED ORDER — PROPOFOL 10 MG/ML IV BOLUS
INTRAVENOUS | Status: AC
  Filled 2024-02-08: qty 20

## 2024-02-08 MED ORDER — FENTANYL CITRATE (PF) 100 MCG/2ML IJ SOLN
INTRAMUSCULAR | Status: AC
  Filled 2024-02-08: qty 2

## 2024-02-08 MED ORDER — ONDANSETRON HCL 4 MG/2ML IJ SOLN
INTRAMUSCULAR | Status: DC | PRN
  Administered 2024-02-08: 4 mg via INTRAVENOUS

## 2024-02-08 MED ORDER — ONDANSETRON HCL 4 MG/2ML IJ SOLN
INTRAMUSCULAR | Status: AC
  Filled 2024-02-08: qty 2

## 2024-02-08 MED ORDER — OXYCODONE HCL 5 MG PO TABS: 5.0000 mg | ORAL_TABLET | Freq: Three times a day (TID) | ORAL | 0 refills | Status: AC | PRN

## 2024-02-08 MED ORDER — FENTANYL CITRATE (PF) 100 MCG/2ML IJ SOLN: 25.0000 ug | INTRAMUSCULAR | Status: DC | PRN

## 2024-02-08 MED ORDER — FENTANYL CITRATE (PF) 50 MCG/ML IJ SOSY
50.0000 ug | PREFILLED_SYRINGE | Freq: Once | INTRAMUSCULAR | Status: AC
  Administered 2024-02-08: 50 ug via INTRAVENOUS

## 2024-02-08 MED ORDER — MIDAZOLAM HCL (PF) 2 MG/2ML IJ SOLN
1.0000 mg | INTRAMUSCULAR | Status: DC | PRN
  Administered 2024-02-08: 1 mg via INTRAVENOUS

## 2024-02-08 MED ORDER — LIDOCAINE HCL (PF) 1 % IJ SOLN
INTRAMUSCULAR | Status: AC
  Filled 2024-02-08: qty 5

## 2024-02-08 MED ORDER — LIDOCAINE HCL (PF) 2 % IJ SOLN
INTRAMUSCULAR | Status: DC | PRN
  Administered 2024-02-08: 80 mg via INTRADERMAL

## 2024-02-08 MED ORDER — ROPIVACAINE HCL 5 MG/ML IJ SOLN
INTRAMUSCULAR | Status: DC | PRN
  Administered 2024-02-08: 30 mL via PERINEURAL

## 2024-02-08 MED ORDER — FENTANYL CITRATE (PF) 50 MCG/ML IJ SOSY
PREFILLED_SYRINGE | INTRAMUSCULAR | Status: AC
  Filled 2024-02-08: qty 1

## 2024-02-08 MED ORDER — 0.9 % SODIUM CHLORIDE (POUR BTL) OPTIME
TOPICAL | Status: DC | PRN
  Administered 2024-02-08: 500 mL

## 2024-02-08 MED ORDER — EPHEDRINE SULFATE-NACL 50-0.9 MG/10ML-% IV SOSY
PREFILLED_SYRINGE | INTRAVENOUS | Status: DC | PRN
  Administered 2024-02-08: 10 mg via INTRAVENOUS

## 2024-02-08 MED ORDER — PROPOFOL 1000 MG/100ML IV EMUL
INTRAVENOUS | Status: AC
  Filled 2024-02-08: qty 100

## 2024-02-08 MED ORDER — ROPIVACAINE HCL 5 MG/ML IJ SOLN
INTRAMUSCULAR | Status: AC
  Filled 2024-02-08: qty 30

## 2024-02-08 MED ORDER — FENTANYL CITRATE (PF) 100 MCG/2ML IJ SOLN
INTRAMUSCULAR | Status: DC | PRN
  Administered 2024-02-08 (×4): 50 ug via INTRAVENOUS

## 2024-02-08 MED ORDER — CHLORHEXIDINE GLUCONATE 0.12 % MT SOLN
OROMUCOSAL | Status: AC
  Filled 2024-02-08: qty 15

## 2024-02-08 MED ORDER — MIDAZOLAM HCL 2 MG/2ML IJ SOLN
INTRAMUSCULAR | Status: AC
  Filled 2024-02-08: qty 2

## 2024-02-08 MED ORDER — LIDOCAINE HCL (PF) 1 % IJ SOLN
INTRAMUSCULAR | Status: DC | PRN
  Administered 2024-02-08: 3 mL via SUBCUTANEOUS

## 2024-02-08 MED ORDER — DROPERIDOL 2.5 MG/ML IJ SOLN: 0.6250 mg | Freq: Once | INTRAMUSCULAR | Status: DC | PRN

## 2024-02-08 MED ORDER — PHENYLEPHRINE 80 MCG/ML (10ML) SYRINGE FOR IV PUSH (FOR BLOOD PRESSURE SUPPORT)
PREFILLED_SYRINGE | INTRAVENOUS | Status: DC | PRN
  Administered 2024-02-08: 80 ug via INTRAVENOUS

## 2024-02-08 MED ORDER — PROPOFOL 500 MG/50ML IV EMUL
INTRAVENOUS | Status: DC | PRN
  Administered 2024-02-08: 125 ug/kg/min via INTRAVENOUS

## 2024-02-08 MED ORDER — SODIUM CHLORIDE 0.9 % IV SOLN: INTRAVENOUS | Status: DC | PRN

## 2024-02-08 MED ORDER — PROPOFOL 10 MG/ML IV BOLUS
INTRAVENOUS | Status: DC | PRN
  Administered 2024-02-08: 100 mg via INTRAVENOUS
  Administered 2024-02-08 (×3): 30 mg via INTRAVENOUS

## 2024-02-08 MED ORDER — EPINEPHRINE 1 MG/10ML IV SOSY
PREFILLED_SYRINGE | INTRAVENOUS | Status: AC
  Filled 2024-02-08: qty 10

## 2024-02-08 MED ORDER — BUPIVACAINE HCL 0.5 % IJ SOLN
INTRAMUSCULAR | Status: DC | PRN
  Administered 2024-02-08: 20 mL

## 2024-02-08 SURGICAL SUPPLY — 23 items
BLADE SURG MINI STRL (BLADE) ×6 IMPLANT
BNDG ELASTIC 3INX 5YD STR LF (GAUZE/BANDAGES/DRESSINGS) IMPLANT
BNDG ELASTIC 4X5.8 VLCR NS LF (GAUZE/BANDAGES/DRESSINGS) ×3 IMPLANT
BNDG ESMARCH 4X12 STRL LF (GAUZE/BANDAGES/DRESSINGS) IMPLANT
CHLORAPREP W/TINT 26 (MISCELLANEOUS) ×3 IMPLANT
CORD BIP STRL DISP 12FT (MISCELLANEOUS) ×3 IMPLANT
CUFF TOURN SGL QUICK 18X4 (TOURNIQUET CUFF) ×3 IMPLANT
DRAPE FLUOR MINI C-ARM 54X84 (DRAPES) IMPLANT
DRSG OPSITE POSTOP 3X4 (GAUZE/BANDAGES/DRESSINGS) ×3 IMPLANT
FORCEPS JEWEL BIP 4-3/4 STR (INSTRUMENTS) ×3 IMPLANT
GAUZE SPONGE 4X4 12PLY STRL (GAUZE/BANDAGES/DRESSINGS) ×3 IMPLANT
GAUZE XEROFORM 1X8 LF (GAUZE/BANDAGES/DRESSINGS) IMPLANT
GLOVE BIO SURGEON STRL SZ7.5 (GLOVE) ×3 IMPLANT
GLOVE BIOGEL PI IND STRL 8 (GLOVE) ×3 IMPLANT
GOWN SRG XL LVL 3 NONREINFORCE (GOWNS) ×3 IMPLANT
NDL KEITH SZ2.5 (NEEDLE) IMPLANT
NS IRRIG 500ML POUR BTL (IV SOLUTION) ×3 IMPLANT
PACK EXTREMITY ARMC (MISCELLANEOUS) ×3 IMPLANT
PADDING CAST BLEND 3X4 STRL (MISCELLANEOUS) IMPLANT
PADDING CAST BLEND 4X4 STRL (MISCELLANEOUS) ×3 IMPLANT
SUT VIC AB 3-0 FS2 27 (SUTURE) IMPLANT
SUTURE ETHLN 4-0 PC3 18XMF (SUTURE) ×3 IMPLANT
WIRE Z .045 C-WIRE SPADE TIP (WIRE) IMPLANT

## 2024-02-08 NOTE — Discharge Instructions (Signed)
 Discharge Instructions After Surgery   Diet   Return to your regular diet as tolerated.   Dressings   You may remove your dressings after 3-4 days.  Okay to shower at this point but do not soak in water (baths, swimming, etc).  It is okay to have a small spot of blood on your dressing as long as it does not keep getting bigger.   In the first 3-4 days, cover the dressing and keep it dry in the shower or bath. Keep your arm up and out of the water.   Do not put any ointments, creams, alcohol , or hydrogen peroxide on your incisions.  Activity   If your fingers are free, work on making a full fist and straightening out the fingers within the limits of your dressing.  Move other joints such as your elbow or shoulder multiple times a day to prevent getting stiff.  No lifting, carrying, pushing, or pulling with your arm until your follow-up visit. You may use your hand or fingers for simple things like writing, typing, eating, and brushing your teeth.   If you were given a sling, wear it while your hand or arm is numb. You can stop wearing the sling when you have feeling and strength back in your hand or arm. You may also use the sling as needed to elevate, protect, or support your arm until your follow-up visit.   Pain   The first 48 hours after surgery can hurt the most. You should use the different types of pain medicine along with rest and elevation to help keep the pain manageable.   NSAIDS such as ibuprofen (Advil or Motrin) or naproxen  (Aleve ) can help with pain and swelling.   Acetaminophen  (Tylenol ) can be used with NSAIDs. Do not take more than 3000 mg of Tylenol  in a 24-hour period.   Narcotic pain medicine such as hydrocodone  or oxycodone , should be used as prescribed. Stop taking as soon as possible. Take with food to help prevent nausea. Do not drink alcohol  or drive while taking narcotics. Be aware that hydrocodone  tablets often already contain acetaminophen  (Tylenol ).   Elevating your operative extremity above the level of your heart will help with pain and swelling. You can rest your arm on several pillows for support while sleeping or resting.   Constipation   Narcotic pain medicines, changes in eating and drinking, and less activity may cause constipation after surgery.   Over-the-counter stool softeners like docusate (Colace), Senna, or Miralax can help. Follow the directions on the bottle.   Stay hydrated and try to eat high fiber foods.   When to call your surgeon   If your dressing gets wet or very dirty.   Fever more than 101 F.   Incision that is very red, swollen, draining pus, or feels hot.   Severe pain, not getting better with pain medicine.   Severe swelling, even with elevation.   If your fingers become cool, cold, or dark in color.   Bleeding that is getting bigger or soaking through the dressing.   Severe nausea and vomiting.   Problems using the bathroom or no bowel movement for 2-3 days.   For questions or concerns, please call [phone].  Jackquline CANDIE Barrack, MD Orthopaedic Surgery [facility]

## 2024-02-08 NOTE — Transfer of Care (Signed)
 Immediate Anesthesia Transfer of Care Note  Patient: Sabrina Arroyo  Procedure(s) Performed: CARPAL TUNNEL RELEASE (Left: Wrist) RELEASE, CUBITAL TUNNEL (Left: Elbow) RELEASE, A1 PULLEY, FOR TRIGGER FINGER (Left: Middle Finger)  Patient Location: PACU  Anesthesia Type:General  Level of Consciousness: drowsy  Airway & Oxygen Therapy: Patient Spontanous Breathing and Patient connected to face mask oxygen  Post-op Assessment: Report given to RN and Post -op Vital signs reviewed and stable  Post vital signs: Reviewed and stable  Last Vitals:  Vitals Value Taken Time  BP 139/78 02/08/24 14:31  Temp 36.6 C 02/08/24 14:30  Pulse 83 02/08/24 14:34  Resp 14 02/08/24 14:34  SpO2 98 % 02/08/24 14:34  Vitals shown include unfiled device data.  Last Pain:  Vitals:   02/08/24 1430  PainSc: Asleep         Complications: No notable events documented.

## 2024-02-08 NOTE — Op Note (Addendum)
 Date of procedure:  02/08/2024  Attending surgeon: Jackquline CANDIE Barrack, MD  Procedure(s) performed:   In-situ left ulnar nerve decompression at the elbow (CPT [phone]) Left carpal tunnel release (CPT [phone]) Left long finger trigger release (CPT [phone])  Assistant(s): none  Preoperative diagnosis:   Left cubital tunnel syndrome Left carpal tunnel syndrome Left long finger triggering  Postoperative diagnosis:   Left cubital tunnel syndrome Left carpal tunnel syndrome Left long finger triggering  Procedure findings:   Constrictive areas across cubital tunnel. Scarring noted across the ulnar and median nerves. Tight carpal tunnel. Thickened A1 pulley. No triggering after release.  Anesthesia:  General  Estimated blood loss:  30 cc  Specimen:  None  Complications:  None  Implants:  None  Indications: 73 y.o. year old female with left cubital and carpal tunnel syndromes and middle finger trigger finger that failed conservative management.  We discussed operative treatment and explained the risks, benefits, and alternatives as well as the expected postoperative course.  The patient elected to proceed with surgery.   Procedure in detail:  The patient was met in the pre-op holding area and the left upper extremity was marked and the consent confirmed.  Anesthesia placed a block.  The patient was then taken to the operating room and the hand table was attached to the stretcher.  Antibiotics were not indicated.  All bony prominences were well padded.  A non-sterile tourniquet was placed on the operative arm.  The operative extremity was then prepped and draped in the usual sterile fashion.  She was converted to general prior to incision as the block was not working. A timeout was performed confirming the correct patient, site, and procedure.   The extremity was exsanguinated with an Esmarch and the tourniquet inflated to 250 mm Hg. A curved incision was made overlying the ulnar nerve at the  medial elbow.  Blunt dissection was carried down to the fascia.  Branches of the medial antebrachial cutaneous nerve were identified and protected.  Starting proximally, the fascia was incised and blunt dissection carried down to the level of the ulnar nerve.  The arcade of Struthers, Osborne's ligament, and the superficial and deep fascia of the FCU were carefully released.  Distally, the first motor branch of the ulnar nerve was identified and protected.  The elbow was ranged and there was no subluxation of the ulnar nerve.    A longitudinal incision was made in line with the radial aspect of the ring finger and extending from just distal to the wrist flexion crease to Kaplan's cardinal line.  Sharp dissection was carried down through the palmar fascia until the transverse carpal ligament was encountered.  Overlying thenar muscle was swept radially off of the transverse carpal ligament.  The transverse carpal ligament was then carefully incised and released in its entirety, distally to the fat pad surrounding the superficial palmar arch and proximally to the antebrachial fascia.  The distal aspect of the antebrachial fascia was released with scissors under direct visualization.    An oblique incision was made overlying the A1 pulley of the operative digit.  Blunt dissection was carried out down to the tendon sheath.  Retractors were placed to protect the digital nerves.  The A1 pulley was then incised in its entirety longitudinally.  After the release, there was no triggering of the digit with active motion. Bupivacaine  was given at the surgical sites at the completion of the case.    The wounds were irrigated with normal saline.  The tourniquet was deflated and hemostasis was obtained with bipolar electrocautery.  The incision at the elbow was closed with 3-0 Vicryl and 4-0 Nylon; the rest were closed with 4-0 Nylon only.  Sterile dressings were applied.  The patient was transferred to the PACU in stable  condition.  Counts were correct x2.

## 2024-02-08 NOTE — Interval H&P Note (Signed)
 History and Physical Interval Note:  02/08/2024 12:24 PM  Sabrina Arroyo  has presented today for surgery, with the diagnosis of Carpal tunnel syndrome on left G56.02 Cubital tunnel syndrome on left G56.22 Trigger middle finger of left hand M65.332.  The various methods of treatment have been discussed with the patient and family. After consideration of risks, benefits and other options for treatment, the patient has consented to  Procedure(s) with comments: CARPAL TUNNEL RELEASE (Left) RELEASE, CUBITAL TUNNEL (Left) RELEASE, A1 PULLEY, FOR TRIGGER FINGER (Left) - Left middle trigger finger release as a surgical intervention.  The patient's history has been reviewed, patient examined, no change in status, stable for surgery.  I have reviewed the patient's chart and labs.  Questions were answered to the patient's satisfaction.     Jackquline GORMAN Barrack

## 2024-02-08 NOTE — Anesthesia Procedure Notes (Signed)
 Anesthesia Regional Block: Supraclavicular block   Pre-Anesthetic Checklist: , timeout performed,  Correct Patient, Correct Site, Correct Laterality,  Correct Procedure, Correct Position, site marked,  Risks and benefits discussed,  Surgical consent,  Pre-op evaluation,  At surgeon's request and post-op pain management  Laterality: Left and Upper  Prep: chloraprep       Needles:  Injection technique: Single-shot  Needle Type: Stimiplex     Needle Length: 5cm  Needle Gauge: 22     Additional Needles:   Procedures:,,,, ultrasound used (permanent image in chart),,    Narrative:  Start time: 02/08/2024 12:28 PM End time: 02/08/2024 12:31 PM Injection made incrementally with aspirations every 5 mL.  Performed by: Personally  Anesthesiologist: Dario Barter, MD  Additional Notes: Functioning IV was confirmed and monitors were applied.  A 50mm 22ga Stimuplex needle was used. Sterile prep and drape,hand hygiene and sterile gloves were used.  Negative aspiration and negative test dose prior to incremental administration of local anesthetic. The patient tolerated the procedure well.

## 2024-02-08 NOTE — Anesthesia Procedure Notes (Signed)
 Procedure Name: Intubation Date/Time: 02/08/2024 1:04 PM  Performed by: Brien Sotero PARAS, CRNAPre-anesthesia Checklist: Patient identified, Patient being monitored, Timeout performed, Emergency Drugs available and Suction available Patient Re-evaluated:Patient Re-evaluated prior to induction Oxygen Delivery Method: Circle system utilized Preoxygenation: Pre-oxygenation with 100% oxygen Induction Type: IV induction LMA: LMA inserted LMA Size: 4.0 Number of attempts: 1 Placement Confirmation: positive ETCO2 and breath sounds checked- equal and bilateral Tube secured with: Tape Dental Injury: Teeth and Oropharynx as per pre-operative assessment

## 2024-02-08 NOTE — Anesthesia Preprocedure Evaluation (Signed)
 Anesthesia Evaluation  Patient identified by MRN, date of birth, ID band Patient awake    Reviewed: Allergy & Precautions, NPO status , Patient's Chart, lab work & pertinent test results  History of Anesthesia Complications Negative for: history of anesthetic complications  Airway Mallampati: IV  TM Distance: >3 FB Neck ROM: Full  Mouth opening: Limited Mouth Opening  Dental  (+) Dental Advisory Given, Caps Upper crowns:   Pulmonary neg shortness of breath, sleep apnea , neg COPD, neg recent URI   Pulmonary exam normal breath sounds clear to auscultation       Cardiovascular negative cardio ROS Normal cardiovascular exam Rhythm:Regular Rate:Normal     Neuro/Psych  PSYCHIATRIC DISORDERS  Depression    negative neurological ROS     GI/Hepatic Neg liver ROS,GERD  Medicated and Controlled,,  Endo/Other  diabetes, Type 2, Oral Hypoglycemic Agents    Renal/GU negative Renal ROS  negative genitourinary   Musculoskeletal  (+) Arthritis ,    Abdominal   Peds  Hematology  (+) Blood dyscrasia (.lastcb), anemia Lab Results      Component                Value               Date                      WBC                      4.4                 03/27/2021                HGB                      10.1 (L)            03/27/2021                HCT                      32.7 (L)            03/27/2021                MCV                      87.4                03/27/2021                PLT                      180                 03/27/2021              Anesthesia Other Findings Past Medical History: No date: Arthritis No date: Cubital tunnel syndrome on left No date: Depression No date: Diabetes mellitus without complication (HCC) No date: Left carpal tunnel syndrome No date: Sleep apnea No date: Trigger finger, left middle finger   Reproductive/Obstetrics                              Anesthesia  Physical Anesthesia Plan  ASA: 3  Anesthesia Plan: General   Post-op Pain Management: Regional block*  Induction: Intravenous  PONV Risk Score and Plan: 3 and Midazolam , Dexamethasone  and Ondansetron   Airway Management Planned: Oral ETT and LMA  Additional Equipment:   Intra-op Plan:   Post-operative Plan: Extubation in OR  Informed Consent: I have reviewed the patients History and Physical, chart, labs and discussed the procedure including the risks, benefits and alternatives for the proposed anesthesia with the patient or authorized representative who has indicated his/her understanding and acceptance.     Dental advisory given  Plan Discussed with: CRNA  Anesthesia Plan Comments:          Anesthesia Quick Evaluation

## 2024-02-20 NOTE — Anesthesia Postprocedure Evaluation (Signed)
"   Anesthesia Post Note  Patient: Sabrina Arroyo  Procedure(s) Performed: CARPAL TUNNEL RELEASE (Left: Wrist) RELEASE, CUBITAL TUNNEL (Left: Elbow) RELEASE, A1 PULLEY, FOR TRIGGER FINGER (Left: Middle Finger)  Patient location during evaluation: PACU Anesthesia Type: General Level of consciousness: awake and alert Pain management: pain level controlled Vital Signs Assessment: post-procedure vital signs reviewed and stable Respiratory status: spontaneous breathing, nonlabored ventilation, respiratory function stable and patient connected to nasal cannula oxygen Cardiovascular status: blood pressure returned to baseline and stable Postop Assessment: no apparent nausea or vomiting Anesthetic complications: no   No notable events documented.   Last Vitals:  Vitals:   02/08/24 1515 02/08/24 1540  BP: 125/82 (!) 139/91  Pulse: 75 85  Resp: 11   Temp: 36.8 C (!) 36.2 C  SpO2: 95% 95%    Last Pain:  Vitals:   02/08/24 1540  TempSrc: Temporal  PainSc:                  Prentice Murphy      "

## 2024-03-13 NOTE — Therapy (Unsigned)
 " OUTPATIENT OCCUPATIONAL THERAPY ORTHO SCREEN  Patient Name: Sabrina Arroyo MRN: [id number] DOB:[birth year], 74 y.o., female Today's Date: 03/14/2024  PCP: Dr Milta MART PROVIDER: DR Ezra  END OF SESSION:  OT End of Session - 03/14/24 1803     Visit Number 0          Past Medical History:  Diagnosis Date   Arthritis    Cubital tunnel syndrome on left    Depression    Diabetes mellitus without complication (HCC)    Left carpal tunnel syndrome    Sleep apnea    Trigger finger, left middle finger    Past Surgical History:  Procedure Laterality Date   ABDOMINAL HYSTERECTOMY     BREAST SURGERY     does not remember which breast   CARPAL TUNNEL RELEASE Left 02/08/2024   Procedure: CARPAL TUNNEL RELEASE;  Surgeon: Ezra Jackquline RAMAN, MD;  Location: [facility];  Service: Orthopedics;  Laterality: Left;   CHOLECYSTECTOMY N/A 03/27/2021   Procedure: LAPAROSCOPIC CHOLECYSTECTOMY;  Surgeon: Eletha Boas, MD;  Location: [facility];  Service: General;  Laterality: N/A;   CLAVICLE SURGERY Left    ENDOSCOPIC RETROGRADE CHOLANGIOPANCREATOGRAPHY (ERCP) WITH PROPOFOL  N/A 03/26/2021   Procedure: ENDOSCOPIC RETROGRADE CHOLANGIOPANCREATOGRAPHY (ERCP) WITH PROPOFOL ;  Surgeon: Saintclair Jasper, MD;  Location: [facility];  Service: Gastroenterology;  Laterality: N/A;   JOINT REPLACEMENT Right    Knee Replacement   REMOVAL OF STONES  03/26/2021   Procedure: REMOVAL OF STONES;  Surgeon: Saintclair Jasper, MD;  Location: [facility];  Service: Gastroenterology;;   ANNETT  03/26/2021   Procedure: ANNETT;  Surgeon: Saintclair Jasper, MD;  Location: [facility];  Service: Gastroenterology;;   TOTAL KNEE ARTHROPLASTY Left 12/31/2020   Procedure: TOTAL KNEE ARTHROPLASTY;  Surgeon: Kathlynn Sharper, MD;  Location: [facility];  Service: Orthopedics;  Laterality: Left;   TRIGGER FINGER RELEASE Left 02/08/2024   Procedure: RELEASE, A1 PULLEY, FOR TRIGGER FINGER;  Surgeon: Ezra Jackquline RAMAN, MD;  Location: [facility]  [facility];  Service: Orthopedics;  Laterality: Left;  Left middle trigger finger release   ULNAR TUNNEL RELEASE Left 02/08/2024   Procedure: RELEASE, CUBITAL TUNNEL;  Surgeon: Ezra Jackquline RAMAN, MD;  Location: [facility];  Service: Orthopedics;  Laterality: Left;   Patient Active Problem List   Diagnosis Date Noted   Respiratory tract infection 12/11/2021   Diabetes mellitus without complication (HCC) 12/11/2021   Generalized weakness 12/11/2021   Transaminitis 12/11/2021   Depression    Abdominal pain 03/24/2021   Gallstones 03/24/2021   Elevated LFTs 03/24/2021   Uncontrolled type 2 diabetes mellitus with hyperglycemia, without long-term current use of insulin  (HCC) 03/24/2021   Malnutrition of moderate degree 03/24/2021   S/P TKR (total knee replacement) using cement, 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 5/5 thumb palmar abduction strength. 4+/5 finger abduction strength. No thenar or interossei atrophy noted. Mildly positive Froment's sign. Sensation intact to light touch to all of the digits. Unable to make a full composite fist and lacks about 0.5 cm of tip to palm distance of the middle finger and ring finger. No active triggering noted on exam. The digits are warm and well-perfused  Imaging and Results: NA  Assessment &  Plan: Sabrina Arroyo is a 74 y.o. female patient who is s/p left cubital tunnel release, left carpal tunnel release, left middle finger trigger finger release on 02/08/2024 - Activities as tolerated - Counseled to continue to work on range of motion of the hand as well as scar massage particularly of the trigger finger incision - Will  place referral to Occupational Therapy to help with range of motion, pain modalities, swelling - Follow-up in 4 weeks   UPPER EXTREMITY ROM:     Active ROM Right eval Left eval  Shoulder flexion    Shoulder abduction    Shoulder adduction    Shoulder extension    Shoulder internal rotation    Shoulder external rotation    Elbow flexion    Elbow extension    Wrist flexion  70  Wrist extension  64  Wrist ulnar deviation    Wrist radial deviation    Wrist pronation    Wrist supination    (Blank rows = not tested)  Active ROM Right eval Left eval  Thumb MCP (0-60)    Thumb IP (0-80)    Thumb Radial abd/add (0-55)     Thumb Palmar abd/add (0-45)     Thumb Opposition to Small Finger     Index MCP (0-90)   80  Index PIP (0-100)   90  Index DIP (0-70)      Long MCP (0-90)    80  Long PIP (0-100)    90  Long DIP (0-70)      Ring MCP (0-90)   85   Ring PIP (0-100)   90   Ring DIP (0-70)      Little MCP (0-90)   85   Little PIP (0-100)    90  Little DIP (0-70)      (Blank rows = not tested)  HAND FUNCTION: Grip strength: Right: 45 lbs; Left: 18 lbs, Lateral pinch: Right: 10 lbs, Left: 4 lbs, and 3 point pinch: Right: 12 lbs, Left: 2 lbs    HEP: Patient to do 2-3 times a day contrast to decrease stiffness decrease edema in left hand and wrist. Fitted with a Isotoner glove to use at nighttime. And issued [employer] -Care scar pad to use on carpal tunnel and trigger finger release scar to decrease scar tissue wear at nighttime on the glove. Patient and daughter was educated on scar massage on carpal tunnel and trigger finger scar to 3 times a day Followed by gentle wrist extension stretch Followed by active range of motion for wrist flexion extension with loose fist 12 reps Followed by tendon glides focusing on range of motion with extension in between 12 reps Patient to focus on decreasing edema decreasing scar tissue increasing motion No strength at this time.  Patient  has out of [employer].  And does not have output of network benefits.       Ancel Peters, OTR/L,CLT 03/14/2024, 6:04 PM   "

## 2024-03-14 ENCOUNTER — Ambulatory Visit: Payer: Medicare (Managed Care) | Admitting: Occupational Therapy

## 2024-03-14 DIAGNOSIS — L905 Scar conditions and fibrosis of skin: Secondary | ICD-10-CM | POA: Insufficient documentation

## 2024-03-14 DIAGNOSIS — M25642 Stiffness of left hand, not elsewhere classified: Secondary | ICD-10-CM | POA: Insufficient documentation

## 2024-03-23 ENCOUNTER — Ambulatory Visit: Payer: Medicare (Managed Care) | Admitting: Occupational Therapy
